# Patient Record
Sex: Male | Born: 1975 | Race: White | Hispanic: No | Marital: Single | Smoking: Current every day smoker
Health system: Southern US, Community
[De-identification: ages and names within clinical notes are randomized; demographics above are authoritative.]

## PROBLEM LIST (undated history)

## (undated) DIAGNOSIS — J45909 Unspecified asthma, uncomplicated: Secondary | ICD-10-CM

## (undated) DIAGNOSIS — D649 Anemia, unspecified: Secondary | ICD-10-CM

## (undated) DIAGNOSIS — I2510 Atherosclerotic heart disease of native coronary artery without angina pectoris: Secondary | ICD-10-CM

## (undated) DIAGNOSIS — N19 Unspecified kidney failure: Secondary | ICD-10-CM

## (undated) DIAGNOSIS — Z992 Dependence on renal dialysis: Secondary | ICD-10-CM

## (undated) DIAGNOSIS — H5440 Blindness, one eye, unspecified eye: Secondary | ICD-10-CM

## (undated) DIAGNOSIS — I10 Essential (primary) hypertension: Secondary | ICD-10-CM

## (undated) HISTORY — PX: KIDNEY TRANSPLANT: SHX239

## (undated) HISTORY — DX: Blindness, one eye, unspecified eye: H54.40

## (undated) HISTORY — DX: Anemia, unspecified: D64.9

## (undated) HISTORY — PX: JOINT REPLACEMENT: SHX530

## (undated) HISTORY — DX: Unspecified asthma, uncomplicated: J45.909

---

## 1998-12-02 ENCOUNTER — Encounter (HOSPITAL_COMMUNITY): Admission: RE | Admit: 1998-12-02 | Discharge: 1999-03-02 | Payer: Self-pay | Admitting: Nephrology

## 1998-12-02 ENCOUNTER — Ambulatory Visit (HOSPITAL_COMMUNITY): Admission: RE | Admit: 1998-12-02 | Discharge: 1998-12-02 | Payer: Self-pay | Admitting: Nephrology

## 1999-03-24 ENCOUNTER — Encounter (HOSPITAL_COMMUNITY): Admission: RE | Admit: 1999-03-24 | Discharge: 1999-06-22 | Payer: Self-pay | Admitting: Nephrology

## 1999-11-03 ENCOUNTER — Encounter (HOSPITAL_COMMUNITY): Admission: RE | Admit: 1999-11-03 | Discharge: 2000-02-01 | Payer: Self-pay | Admitting: Nephrology

## 1999-11-22 ENCOUNTER — Encounter: Payer: Self-pay | Admitting: Vascular Surgery

## 1999-11-22 ENCOUNTER — Ambulatory Visit (HOSPITAL_COMMUNITY): Admission: RE | Admit: 1999-11-22 | Discharge: 1999-11-22 | Payer: Self-pay | Admitting: Vascular Surgery

## 2000-08-08 ENCOUNTER — Encounter: Admission: RE | Admit: 2000-08-08 | Discharge: 2000-08-08 | Payer: Self-pay | Admitting: Nephrology

## 2000-08-08 ENCOUNTER — Encounter: Payer: Self-pay | Admitting: Nephrology

## 2000-10-09 ENCOUNTER — Ambulatory Visit (HOSPITAL_COMMUNITY): Admission: RE | Admit: 2000-10-09 | Discharge: 2000-10-09 | Payer: Self-pay | Admitting: Nephrology

## 2000-10-09 ENCOUNTER — Encounter: Payer: Self-pay | Admitting: Nephrology

## 2002-09-10 ENCOUNTER — Encounter (INDEPENDENT_AMBULATORY_CARE_PROVIDER_SITE_OTHER): Payer: Self-pay | Admitting: *Deleted

## 2002-09-10 ENCOUNTER — Inpatient Hospital Stay (HOSPITAL_COMMUNITY): Admission: RE | Admit: 2002-09-10 | Discharge: 2002-09-12 | Payer: Self-pay | Admitting: Surgery

## 2002-09-10 ENCOUNTER — Encounter: Payer: Self-pay | Admitting: Surgery

## 2002-11-21 ENCOUNTER — Encounter: Admission: RE | Admit: 2002-11-21 | Discharge: 2002-11-21 | Payer: Self-pay | Admitting: Nephrology

## 2002-11-21 ENCOUNTER — Encounter: Payer: Self-pay | Admitting: Nephrology

## 2002-12-26 ENCOUNTER — Emergency Department (HOSPITAL_COMMUNITY): Admission: EM | Admit: 2002-12-26 | Discharge: 2002-12-26 | Payer: Self-pay | Admitting: Emergency Medicine

## 2002-12-26 ENCOUNTER — Encounter: Payer: Self-pay | Admitting: Emergency Medicine

## 2003-01-04 ENCOUNTER — Encounter: Payer: Self-pay | Admitting: Emergency Medicine

## 2003-01-04 ENCOUNTER — Emergency Department (HOSPITAL_COMMUNITY): Admission: EM | Admit: 2003-01-04 | Discharge: 2003-01-05 | Payer: Self-pay | Admitting: Emergency Medicine

## 2003-10-02 ENCOUNTER — Ambulatory Visit (HOSPITAL_COMMUNITY): Admission: RE | Admit: 2003-10-02 | Discharge: 2003-10-02 | Payer: Self-pay | Admitting: Nephrology

## 2005-02-03 ENCOUNTER — Ambulatory Visit (HOSPITAL_COMMUNITY): Admission: RE | Admit: 2005-02-03 | Discharge: 2005-02-03 | Payer: Self-pay | Admitting: Nephrology

## 2009-04-02 ENCOUNTER — Ambulatory Visit: Payer: Self-pay | Admitting: *Deleted

## 2009-09-24 ENCOUNTER — Inpatient Hospital Stay (HOSPITAL_COMMUNITY): Admission: EM | Admit: 2009-09-24 | Discharge: 2009-09-29 | Payer: Self-pay | Admitting: Emergency Medicine

## 2009-09-25 ENCOUNTER — Ambulatory Visit: Payer: Self-pay | Admitting: Surgery

## 2009-09-28 ENCOUNTER — Ambulatory Visit: Payer: Self-pay | Admitting: Gastroenterology

## 2009-10-15 DIAGNOSIS — I10 Essential (primary) hypertension: Secondary | ICD-10-CM | POA: Insufficient documentation

## 2009-10-15 DIAGNOSIS — N186 End stage renal disease: Secondary | ICD-10-CM

## 2009-10-15 DIAGNOSIS — E785 Hyperlipidemia, unspecified: Secondary | ICD-10-CM | POA: Insufficient documentation

## 2009-10-16 ENCOUNTER — Ambulatory Visit: Payer: Self-pay | Admitting: Pulmonary Disease

## 2009-10-16 DIAGNOSIS — M310 Hypersensitivity angiitis: Secondary | ICD-10-CM

## 2009-10-20 ENCOUNTER — Telehealth (INDEPENDENT_AMBULATORY_CARE_PROVIDER_SITE_OTHER): Payer: Self-pay | Admitting: *Deleted

## 2009-10-21 ENCOUNTER — Ambulatory Visit: Payer: Self-pay | Admitting: Vascular Surgery

## 2009-10-26 ENCOUNTER — Ambulatory Visit (HOSPITAL_COMMUNITY): Admission: RE | Admit: 2009-10-26 | Discharge: 2009-10-26 | Payer: Self-pay | Admitting: Vascular Surgery

## 2010-10-14 NOTE — Assessment & Plan Note (Signed)
Summary: consult for Goodpasture's   Copy to:  Elvis Coil Primary Provider/Referring Provider:  Deterding  CC:  Pulmonary Consult.  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  He was diagnosed at age 53 with the symptoms of hemoptysis and hematuria, and was treated with cytoxan, prednisone, and what sounds like plasmapheresis.  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  The pt is currently on chronic HD.  He has not been on cytoxan for years, and has been off prednisone for 6mos prior to his recent surgery (was on a short taper post op).  The pt has had no further hemoptysis since his original presentation that resulted in his diagnosis.  He denies any exercise tolerance issues, cough, congestion, or other pulmonary symptoms.  His cxr recently is unremarkable.  Preventive Screening-Counseling & Management  Alcohol-Tobacco     Smoking Status: quit  Current Medications (verified): 1)  Labetalol Hcl 200 Mg Tabs (Labetalol Hcl) .Marland Kitchen.. 1 Two Times A Day 2)  Nephro-Vite 0.8 Mg Tabs (B Complex-C-Folic Acid) .Marland Kitchen.. 1 Once Daily 3)  Lisinopril 10 Mg Tabs (Lisinopril) .Marland Kitchen.. 1 At Bedtime 4)  Phoslo 667 Mg Caps (Calcium Acetate (Phos Binder)) .... Take 3 Tabs Before Meals  Allergies (verified): No Known Drug Allergies  Past History:  Past Medical History:  HYPERTENSION (ICD-401.9) HYPERLIPIDEMIA (ICD-272.4) ESRD (ICD-585.6) Good Pasture Syndrome.   Past Surgical History: Nephrectomy: R kidney Transplant 2006 and then removed 09/2009. L kidney removed   Family History: Reviewed history and no changes required. heart disease: father   Social History: Reviewed history and no changes required. Patient states former smoker.  started at age 50.  1 ppd.  quit 2006 pt is  single.  Lives with girlfriend, Jonetta Osgood. Pt has children. pt is disabled.  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  Smoking Status:  quit  Review of Systems       The patient complains of loss of appetite.  The patient denies shortness of breath with activity, shortness of breath at rest, productive cough, non-productive cough, coughing up blood, chest pain, irregular heartbeats, acid heartburn, indigestion, weight change, abdominal pain, difficulty swallowing, sore throat, tooth/dental problems, headaches, nasal congestion/difficulty breathing through nose, sneezing, itching, ear ache, anxiety, depression, hand/feet swelling, joint stiffness or pain, rash, change in color of mucus, and fever.    Vital Signs:  Patient profile:   35 year old male Height:      72 inches Weight:      158.13 pounds BMI:     21.52 O2 Sat:      98 % on Room air Temp:     98.1 degrees F oral Pulse rate:   111 / minute BP sitting:   148 / 92  (right arm) Cuff size:   regular  Vitals Entered By: Arman Filter LPN (October 16, 2009 9:09 AM)  O2 Flow:  Room air CC: Pulmonary Consult Comments Medications reviewed with patient Arman Filter LPN  October 16, 2009 9:10 AM    Physical Exam  General:  thin male in nad Eyes:  PERRLA and EOMI.   Nose:  patent without discharge or purulence Mouth:  clear Neck:  no jvd, tmg, LN central catheter tunnel noted. Lungs:  totally clear to auscultation Heart:  rrr, no mrg. Abdomen:  soft and nontender, bs+  Extremities:  no edema, pulses intact distally no cyanosis Neurologic:  alert and oriented,  moves all 4.   Impression & Recommendations:  Problem # 1:  GOODPASTURES SYNDROME (ICD-446.21) The pt has a history of Goodpasture's syndrome, but has not had hemoptysis since his initial presentation at age 81.  He currently does not have any pulmonary symptoms, and his most recent cxr is clear.  He is now off immunosuppression, and the possibility always exists  that he could have a recurrence of his disease in the lungs.  At this point, would just monitor him and see how things go.  I would be happy to see again if issues arise.  Medications Added to Medication List This Visit: 1)  Phoslo 667 Mg Caps (Calcium acetate (phos binder)) .... Take 3 tabs before meals  Other Orders: Consultation Level III (04540)  Patient Instructions: 1)  no pulmonary intervention at this time 2)  please call if pulmonary issues develop, and will see as needed.

## 2010-10-14 NOTE — Progress Notes (Signed)
Summary: fax request  Phone Note From Pharmacy   Caller: Morrie Sheldon at [facility] Call For: clance  Summary of Call: please fax ov notes when available to attn: ashley- [phone]. call back # [phone] Initial call taken by: Tivis Ringer, CNA,  October 20, 2009 9:54 AM  Follow-up for Phone Call        megan, please have note faxed, but also need last note from [facility] on this patient as well.  faxed note 2/14//Juanita  Follow-up by: Barbaraann Share MD,  October 24, 2009 11:35 AM  Additional Follow-up for Phone Call Additional follow up Details #1::        Juanita faxed note today. Aundra Millet Reynolds LPN  October 26, 2009 9:55 AM

## 2010-11-28 LAB — DIFFERENTIAL
Basophils Relative: 0 % (ref 0–1)
Eosinophils Absolute: 0.2 10*3/uL (ref 0.0–0.7)
Eosinophils Relative: 5 % (ref 0–5)
Lymphocytes Relative: 20 % (ref 12–46)
Lymphs Abs: 1 10*3/uL (ref 0.7–4.0)
Monocytes Relative: 10 % (ref 3–12)
Neutro Abs: 3.3 10*3/uL (ref 1.7–7.7)
Neutrophils Relative %: 65 % (ref 43–77)

## 2010-11-28 LAB — CBC
HCT: 26 % — ABNORMAL LOW (ref 39.0–52.0)
HCT: 26.3 % — ABNORMAL LOW (ref 39.0–52.0)
Hemoglobin: 9 g/dL — ABNORMAL LOW (ref 13.0–17.0)
Hemoglobin: 9.9 g/dL — ABNORMAL LOW (ref 13.0–17.0)
MCHC: 34.4 g/dL (ref 30.0–36.0)
MCHC: 34.7 g/dL (ref 30.0–36.0)
MCHC: 34.8 g/dL (ref 30.0–36.0)
MCHC: 35.3 g/dL (ref 30.0–36.0)
MCV: 82.7 fL (ref 78.0–100.0)
MCV: 83.1 fL (ref 78.0–100.0)
Platelets: 30 10*3/uL — ABNORMAL LOW (ref 150–400)
RBC: 3.15 MIL/uL — ABNORMAL LOW (ref 4.22–5.81)
RBC: 3.37 MIL/uL — ABNORMAL LOW (ref 4.22–5.81)
WBC: 3.8 10*3/uL — ABNORMAL LOW (ref 4.0–10.5)
WBC: 4.1 10*3/uL (ref 4.0–10.5)

## 2010-11-28 LAB — RENAL FUNCTION PANEL
Albumin: 2.7 g/dL — ABNORMAL LOW (ref 3.5–5.2)
Albumin: 2.8 g/dL — ABNORMAL LOW (ref 3.5–5.2)
Albumin: 2.9 g/dL — ABNORMAL LOW (ref 3.5–5.2)
BUN: 120 mg/dL — ABNORMAL HIGH (ref 6–23)
BUN: 121 mg/dL — ABNORMAL HIGH (ref 6–23)
CO2: 15 mEq/L — ABNORMAL LOW (ref 19–32)
CO2: 18 mEq/L — ABNORMAL LOW (ref 19–32)
CO2: 18 mEq/L — ABNORMAL LOW (ref 19–32)
Calcium: 5.5 mg/dL — CL (ref 8.4–10.5)
Chloride: 93 mEq/L — ABNORMAL LOW (ref 96–112)
Chloride: 93 mEq/L — ABNORMAL LOW (ref 96–112)
Creatinine, Ser: 25.52 mg/dL (ref 0.4–1.5)
GFR calc Af Amer: 2 mL/min — ABNORMAL LOW (ref >60)
GFR calc Af Amer: 2 mL/min — ABNORMAL LOW (ref >60)
GFR calc Af Amer: 3 mL/min — ABNORMAL LOW (ref >60)
GFR calc non Af Amer: 2 mL/min — ABNORMAL LOW (ref >60)
GFR calc non Af Amer: 2 mL/min — ABNORMAL LOW (ref >60)
GFR calc non Af Amer: 2 mL/min — ABNORMAL LOW (ref >60)
Phosphorus: 9 mg/dL — ABNORMAL HIGH (ref 2.3–4.6)
Potassium: 4 mEq/L (ref 3.5–5.1)

## 2010-11-28 LAB — PTH, INTACT AND CALCIUM
Calcium, Total (PTH): 4.5 mg/dL — CL (ref 8.4–10.5)
PTH: 90.3 pg/mL — ABNORMAL HIGH (ref 14.0–72.0)

## 2010-11-28 LAB — URINALYSIS, ROUTINE W REFLEX MICROSCOPIC
Glucose, UA: NEGATIVE mg/dL
Ketones, ur: 15 mg/dL — AB
Nitrite: NEGATIVE
Urobilinogen, UA: 0.2 mg/dL (ref 0.0–1.0)

## 2010-11-28 LAB — URINE CULTURE: Colony Count: NO GROWTH

## 2010-11-28 LAB — URINE MICROSCOPIC-ADD ON

## 2010-11-28 LAB — COMPREHENSIVE METABOLIC PANEL
Calcium: 4.6 mg/dL — CL (ref 8.4–10.5)
Chloride: 90 mEq/L — ABNORMAL LOW (ref 96–112)
Creatinine, Ser: 24.6 mg/dL (ref 0.4–1.5)
Sodium: 128 mEq/L — ABNORMAL LOW (ref 135–145)
Total Bilirubin: 0.5 mg/dL (ref 0.3–1.2)

## 2010-11-28 LAB — PATHOLOGIST SMEAR REVIEW

## 2010-11-28 LAB — PLT GLYCOPROTEIN (INDIRECT) AUTOABS
Plt GP Ia/IIa Autoabs: DETECTED
Plt Glycoprotein Ib/IX Autoabs: NOT DETECTED

## 2010-11-28 LAB — GLOMERULAR BASEMENT MEMBRANE ANTIBODIES

## 2010-11-28 LAB — CALCIUM, IONIZED: Calcium, Ion: 0.52 mmol/L — CL (ref 1.12–1.32)

## 2010-11-28 LAB — FERRITIN: Ferritin: 1086 ng/mL — ABNORMAL HIGH (ref 22–322)

## 2010-11-28 LAB — HEPATITIS B SURFACE ANTIGEN: Hepatitis B Surface Ag: NEGATIVE

## 2010-11-28 LAB — IRON AND TIBC: UIBC: 22 ug/dL

## 2010-11-29 LAB — CBC
HCT: 23.7 % — ABNORMAL LOW (ref 39.0–52.0)
HCT: 24.1 % — ABNORMAL LOW (ref 39.0–52.0)
HCT: 25.2 % — ABNORMAL LOW (ref 39.0–52.0)
HCT: 25.9 % — ABNORMAL LOW (ref 39.0–52.0)
Hemoglobin: 8.3 g/dL — ABNORMAL LOW (ref 13.0–17.0)
Hemoglobin: 8.4 g/dL — ABNORMAL LOW (ref 13.0–17.0)
Hemoglobin: 8.6 g/dL — ABNORMAL LOW (ref 13.0–17.0)
Hemoglobin: 8.9 g/dL — ABNORMAL LOW (ref 13.0–17.0)
MCHC: 34.3 g/dL (ref 30.0–36.0)
MCHC: 34.3 g/dL (ref 30.0–36.0)
MCHC: 34.7 g/dL (ref 30.0–36.0)
MCV: 83.1 fL (ref 78.0–100.0)
MCV: 83.1 fL (ref 78.0–100.0)
MCV: 83.8 fL (ref 78.0–100.0)
Platelets: 32 10*3/uL — ABNORMAL LOW (ref 150–400)
Platelets: 40 10*3/uL — ABNORMAL LOW (ref 150–400)
RBC: 2.85 MIL/uL — ABNORMAL LOW (ref 4.22–5.81)
RBC: 2.93 MIL/uL — ABNORMAL LOW (ref 4.22–5.81)
RBC: 3.03 MIL/uL — ABNORMAL LOW (ref 4.22–5.81)
RDW: 13.9 % (ref 11.5–15.5)
WBC: 4.1 10*3/uL (ref 4.0–10.5)

## 2010-11-29 LAB — RENAL FUNCTION PANEL
Albumin: 2.6 g/dL — ABNORMAL LOW (ref 3.5–5.2)
Albumin: 2.7 g/dL — ABNORMAL LOW (ref 3.5–5.2)
Albumin: 2.7 g/dL — ABNORMAL LOW (ref 3.5–5.2)
BUN: 72 mg/dL — ABNORMAL HIGH (ref 6–23)
BUN: 79 mg/dL — ABNORMAL HIGH (ref 6–23)
CO2: 28 mEq/L (ref 19–32)
Calcium: 5.6 mg/dL — CL (ref 8.4–10.5)
Calcium: 6 mg/dL — CL (ref 8.4–10.5)
Calcium: 6.6 mg/dL — ABNORMAL LOW (ref 8.4–10.5)
Calcium: 6.8 mg/dL — ABNORMAL LOW (ref 8.4–10.5)
Chloride: 96 mEq/L (ref 96–112)
Chloride: 96 mEq/L (ref 96–112)
Chloride: 97 mEq/L (ref 96–112)
Creatinine, Ser: 10.94 mg/dL — ABNORMAL HIGH (ref 0.4–1.5)
Creatinine, Ser: 14.81 mg/dL — ABNORMAL HIGH (ref 0.4–1.5)
Creatinine, Ser: 15.6 mg/dL — ABNORMAL HIGH (ref 0.4–1.5)
GFR calc Af Amer: 3 mL/min — ABNORMAL LOW (ref >60)
GFR calc Af Amer: 4 mL/min — ABNORMAL LOW (ref >60)
GFR calc Af Amer: 5 mL/min — ABNORMAL LOW (ref >60)
GFR calc Af Amer: 7 mL/min — ABNORMAL LOW (ref >60)
GFR calc non Af Amer: 3 mL/min — ABNORMAL LOW (ref >60)
GFR calc non Af Amer: 4 mL/min — ABNORMAL LOW (ref >60)
GFR calc non Af Amer: 4 mL/min — ABNORMAL LOW (ref >60)
GFR calc non Af Amer: 5 mL/min — ABNORMAL LOW (ref >60)
Glucose, Bld: 117 mg/dL — ABNORMAL HIGH (ref 70–99)
Glucose, Bld: 137 mg/dL — ABNORMAL HIGH (ref 70–99)
Phosphorus: 6 mg/dL — ABNORMAL HIGH (ref 2.3–4.6)
Phosphorus: 6.9 mg/dL — ABNORMAL HIGH (ref 2.3–4.6)
Sodium: 132 mEq/L — ABNORMAL LOW (ref 135–145)
Sodium: 136 mEq/L (ref 135–145)

## 2010-11-29 LAB — TYPE AND SCREEN: ABO/RH(D): O POS

## 2010-11-29 LAB — IRON AND TIBC: UIBC: <55 ug/dL

## 2010-11-29 LAB — STOOL CULTURE

## 2010-11-29 LAB — HEPARIN INDUCED THROMBOCYTOPENIA PNL
Heparin Induced Plt Ab: POSITIVE
Serotonin Release: 0 % release (ref <20)

## 2010-11-29 LAB — LACTATE DEHYDROGENASE: LDH: 333 U/L — ABNORMAL HIGH (ref 94–250)

## 2010-11-29 LAB — DIC (DISSEMINATED INTRAVASCULAR COAGULATION)PANEL
INR: 1.09 (ref 0.00–1.49)
Prothrombin Time: 14 seconds (ref 11.6–15.2)
aPTT: 44 seconds — ABNORMAL HIGH (ref 24–37)

## 2010-11-29 LAB — DIFFERENTIAL
Basophils Relative: 0 % (ref 0–1)
Monocytes Absolute: 0.3 10*3/uL (ref 0.1–1.0)
Monocytes Relative: 10 % (ref 3–12)
Neutro Abs: 2.2 10*3/uL (ref 1.7–7.7)

## 2010-11-29 LAB — CLOSTRIDIUM DIFFICILE EIA

## 2010-11-29 LAB — APTT: aPTT: 46 seconds — ABNORMAL HIGH (ref 24–37)

## 2010-11-29 LAB — HEMOCCULT GUIAC POC 1CARD (OFFICE): Fecal Occult Bld: POSITIVE

## 2010-11-29 LAB — EHEC TOXIN BY EIA, STOOL

## 2010-12-03 LAB — POCT I-STAT 4, (NA,K, GLUC, HGB,HCT)
HCT: 30 % — ABNORMAL LOW (ref 39.0–52.0)
Hemoglobin: 10.2 g/dL — ABNORMAL LOW (ref 13.0–17.0)
Potassium: 4.5 mEq/L (ref 3.5–5.1)
Sodium: 136 mEq/L (ref 135–145)

## 2011-01-25 NOTE — Procedures (Signed)
CEPHALIC VEIN MAPPING   INDICATION:  Preop evaluation for AV fistula placement.   HISTORY:  End-stage renal disease.   EXAM:  The right cephalic vein is compressible with diameter measurements  ranging from 0.18 to 0.31 cm.   The left basilic vein is compressible with diameter measurements ranging  from 0.68 to 0.84 cm.   The left cephalic vein is compressible at the upper arm level with  diameter measurements ranging from 0.26 to 0.38 cm.   The left basilic vein is compressible with diameter measurements ranging  from 0.59 to 0.65 cm.   See attached worksheet for all measurements.   IMPRESSION:  Patent bilateral cephalic and basilic veins with diameter  measurements as described above and on the attached worksheet.   ___________________________________________  Janetta Hora. Fields, MD   CH/MEDQ  D:  10/23/2009  T:  10/23/2009  Job:  [id number]

## 2011-01-25 NOTE — Assessment & Plan Note (Signed)
OFFICE VISIT   Curtis Rangel, Curtis Rangel A  DOB:  04-01-1976                                       04/02/2009  ZOXWR#:[id number]   The patient is a 35 year old gentleman with a history of end-stage renal  failure and a left Cimino arteriovenous fistula created approximately 10  years ago by Dr. Hart Rochester.  He now has kidney transplant and has been off  hemodialysis for 3 years.  His left arm AV fistula has functioned until  recently when he traumatized it and noticed a loss of the thrill.   Evaluation does reveal a thrombosed left Cimino arteriovenous fistula.  There is some mild erythema over this.  He  has a 2+ left radial pulse.   The patient has had recent traumatic thrombosis of his left arm Cimino  fistula.  I think this will settle down with conservative treatment.  Asked him to put some warm compresses on this.  If it remains painful he  will return to the office for further assessment and possible excision.   Balinda Quails, M.D.  Electronically Signed   PGH/MEDQ  D:  04/02/2009  T:  04/03/2009  Job:  [id number]   cc:   Fayrene Fearing L. Deterding, M.D.

## 2011-01-25 NOTE — Assessment & Plan Note (Signed)
OFFICE VISIT   Curtis Rangel, Curtis Rangel A  DOB:  1976-06-29                                       10/21/2009  ZOXWR#:[id number]   CHIEF COMPLAINT:  Needs access for dialysis.   HISTORY OF PRESENT ILLNESS:  The patient is a 35 year old male who  previously was on hemodialysis, but received a kidney transplant.  This  kidney transplant lasted 5 years.  He now is requiring hemodialysis  again.  He currently has a right-sided catheter.  He dialyzes on  Tuesday, Thursday, and Saturday.  He is right-handed.  He previously had  a left radiocephalic AV fistula which lasted for several years.   CHRONIC MEDICAL PROBLEMS:  Include hypertension, elevated cholesterol,  and renal failure, all which are currently controlled.   PAST SURGICAL HISTORY:  He had a right nephrectomy.  He had a kidney  transplant.  He has had a dialysis catheter and the above-mentioned  fistula.   FAMILY HISTORY:  Unremarkable.   SOCIAL HISTORY:  He is single and has 1 child.  He is a former smoker  but quit 5 years ago.  He does not consume alcohol regularly.   REVIEW OF SYSTEMS:  Full 12-point review of systems was performed with  the patient.  Please see intake referral form for details regarding  this.   PHYSICAL EXAM:  Blood pressure is 154/87 in the right arm, heart rate 97  and regular, temperature is 98.2.  HEENT is unremarkable.  Neck has 2+  carotid pulses without bruit.  Chest is clear to auscultation.  Cardiac  exam is regular rate and rhythm.  Upper extremities:  He has 2+ brachial  and radial pulses bilaterally.  Skin:  He has no rashes.  Left forearm  is remarkable for a thrombosed left AV fistula.  On placement of  tourniquet in the left upper arm, there is an easily palpable large  basilic vein extending down below the antecubital crease.   He had a vein mapping ultrasound today which shows the basilic vein is  between 58 and 84 mm in diameter.  The upper arm cephalic vein is  between 26 and 38 mm in diameter.  It has a few side branches pain.  Vein mapping on the right side has similar features.  The cephalic vein  at the wrist level on the right is fairly small.   I believe the best option for the patient at this point would be  placement of a left basilic vein transposition fistula.  I do not  believe that the cephalic vein in the upper arm would be big enough and  the basilic vein is certainly large enough on exam and by ultrasound.  We have scheduled this for October 26, 2009.  Risks, benefits, possible  complications, procedure details were explained to the patient today  including but not limited to bleeding, infection, ischemic steal,  nonmaturation of the fistula, and long-term thrombosis.  He understands  and agrees to proceed.     Janetta Hora. Fields, MD  Electronically Signed   CEF/MEDQ  D:  10/26/2009  T:  10/26/2009  Job:  [id number]   cc:   Garnetta Buddy, M.D.

## 2011-01-28 NOTE — Op Note (Signed)
NAME:  Curtis Rangel, Curtis Rangel                        ACCOUNT NO.:  [id number]   MEDICAL RECORD NO.:  [id number]                   PATIENT TYPE:  INP   LOCATION:  2550                                 FACILITY:  [facility]   PHYSICIAN:  Velora Heckler, M.D.                DATE OF BIRTH:  1975/11/16   DATE OF PROCEDURE:  09/10/2002  DATE OF DISCHARGE:                                 OPERATIVE REPORT   PREOPERATIVE DIAGNOSIS:  Secondary hyperparathyroidism, end-stage renal  disease.   POSTOPERATIVE DIAGNOSIS:  Secondary hyperparathyroidism, end-stage renal  disease.   OPERATION:  Total parathyroidectomy with auto transplantation to right  forearm.   SURGEON:  Velora Heckler, M.D.   ASSISTANT:  Abigail Miyamoto, M.D.   ANESTHESIA:  General   ANESTHESIOLOGIST:  Edwin Cap. Zoila Shutter, M.D.   ESTIMATED BLOOD LOSS:  Minimal   PREPARATION:  Betadine   COMPLICATIONS:  None   INDICATIONS FOR PROCEDURE:  The patient is a 35 year old white male from  [place], [place] who presents at the request of Dr. Fayrene Fearing  Deterding for evaluation of secondary hyperparathyroidism.  The patient has  had end-stage renal disease due to Goodpasture's Syndrome.  He has been on  hemodialysis for two years.  The patient is on the transplant list at [facility].  The patient; however, was diagnosed with  secondary hyperparathyroidism and now comes for surgical treatment.   DESCRIPTION OF PROCEDURE:  The procedure is done in OR #17 at the [facility].  The patient is brought to the operating room and  placed in a supine position on the operating room table.  Following the  administration of general anesthesia, the patient is positioned and then  prepped and draped in the usual strict aseptic fashion.  After ascertaining  that an adequate level of anesthesia had been obtained, a Kocher incision is  made in the anterior neck with a #10 blade.  Dissection is  carried down  through the subcutaneous tissues and platysma.  Hemostasis is obtained with  the electrocautery.  Skin flaps are developed cephalad and caudad from the  thyroid notch to the sternal notch.  A mini Horner self retaining retractor  is placed for exposure.  Strap muscles are incised in the midline.  Dissection is begun on the left side of the neck.  The strap muscles are  reflected laterally.  Left thyroid lobe is gently dissected out.  Venous  tributaries are divided between small ligaclips.  On exploration along the  left tracheoesophageal groove, an apparent hypoplastic parathyroid gland was  identified in the thyrothymic tract.  It is dissected out.  Vascular pedicle  was divided between small ligaclips.  Gland is completely excised.  It  measures 1.5 x 0.9 x 0.7 cm.  Frozen section biopsy is submitted to  pathology. Dr. Charlott Rakes reviewed this and saw only thymic tissue and no  evidence of parathyroid tissue.  A second fragment of the left inferior  gland was taken and submitted to pathology.  At this time, a reading of  parathyroid tissue was definitely seen.  Further exploration in the left  neck reveals a subcapsular left superior parathyroid gland.  The capsule of  the thyroid is open and this gland is carefully dissected out.  Vascular  pedicle was again divided between small ligaclips.  Gland is completely  excised.  Measurements show it to be 1.3 x 1.1 x 0.5 cm in size.  Frozen  section biopsy again confirms parathyroid tissue.  Dry pack is placed in the  left neck.  We then turned our attention to the right side.  Again, strap  muscles are reflected laterally.  Venous tributaries are divided between  small ligaclips.  Immediately on exploring the right thyroid lobe, there is  an inferior parathyroid gland adherent to the inferior pole of the thyroid.  It is quite evident.  It is carefully dissected out.  Vascular pedicles  again divided between small ligaclips.   Gland is freed from adventitial  tissue and excised completely from the neck.  Measurements of the right  inferior parathyroid gland show it to be 1.7 x 1.5 x 1.0 cm in size.  Frozen  section biopsy again confirms parathyroid tissue.  Further exploration on  the right side requires mobilization of the entire right lobe.  On palpation  a gland is palpable along the superior pole.  This gland is quite high and  located lateral to the superior pole vessels.  It is carefully dissected  out.  Capsule was opened to mobilize the gland.  Vascular pedicle comes in  to the gland from the superior aspect.  It is divided between medium  ligaclips.  Gland is then excised off of the capsule of the thyroid.  It is  completely removed from the neck in its totality.  It measures 2.4 x 1.0 x  1.1 cm in size.  Frozen section biopsy again confirms parathyroid tissue.  A  dry pack was placed in the right neck.  Left neck is then re-examined.  There is a small nodule of tissue which is suspended from the inferior pole  of the left thyroid lobe.  It is excised and submitted for frozen section  biopsy.  It does demonstrate benign thyroid tissue.  Good hemostasis is  obtained on both sides of the neck.  Strap muscles were reapproximated in  the midline with interrupted 3-0 Vicryl sutures.  Platysma is reapproximated  with interrupted 3-0 Vicryl sutures.  Skin edges were reapproximated with  widely spaced stainless steel staples and the interspace with 1/2 inch Steri-  Strips.  Next, we turned our attention to the right forearm.  Right forearm  is prepped and draped in the usual strict aseptic fashion.  After  ascertaining that adequate anesthesia had been maintained, a 5 cm incision  was made with a #10 blade.  Dissection was carried down through the  subcutaneous tissues.  Hemostasis obtained with the electrocautery.  Skin  flaps are developed, leaving the muscle fascia intact.  A Weitlander retractor was placed  for exposure.  The left superior parathyroid gland is  selected.  In iced saline, it is suctioned into 12 fragments measuring  approximately 1 mm cubed each.  Each of these 12 fragments is then implanted  into the right brachioradialis muscle by incising the fascia.  Splitting the  muscle, inserting the fragment of parathyroid tissue,  and closing the fascia  with 4-0 Prolene suture.  This was performed 12 times with a suture marking  the site of insertion of each fragment of parathyroid gland.  Good  hemostasis is noted.  Subcutaneous tissue is reapproximated with interrupted  3-0 Vicryl suture.  Skin edges were reapproximated with widely spaced  stainless steel staples and 1/2 inch Steri-Strips.  Wounds were washed and  dried and sterile dressings are applied to each.  The patient was awakened  from anesthesia and brought to the recovery room in stable condition.  The  patient tolerated the procedure well.                                               Velora Heckler, M.D.    TMG/MEDQ  D:  09/10/2002  T:  09/10/2002  Job:  [id number]   cc:   Fayrene Fearing L. Deterding, M.D.  [address] [place]  [place] [address]  Fax: [phone]   [facility]

## 2011-02-06 IMAGING — CR DG CHEST 2V
2 series · 2 of 2 positions shown · non-contrast
Comparison: Report of chest radiograph 01/04/2003.  Images are no
longer available.

CLINICAL DATA: Abdominal pain.  Renal failure.  Post transplant.

CHEST - 2 VIEW

[w chest pa]
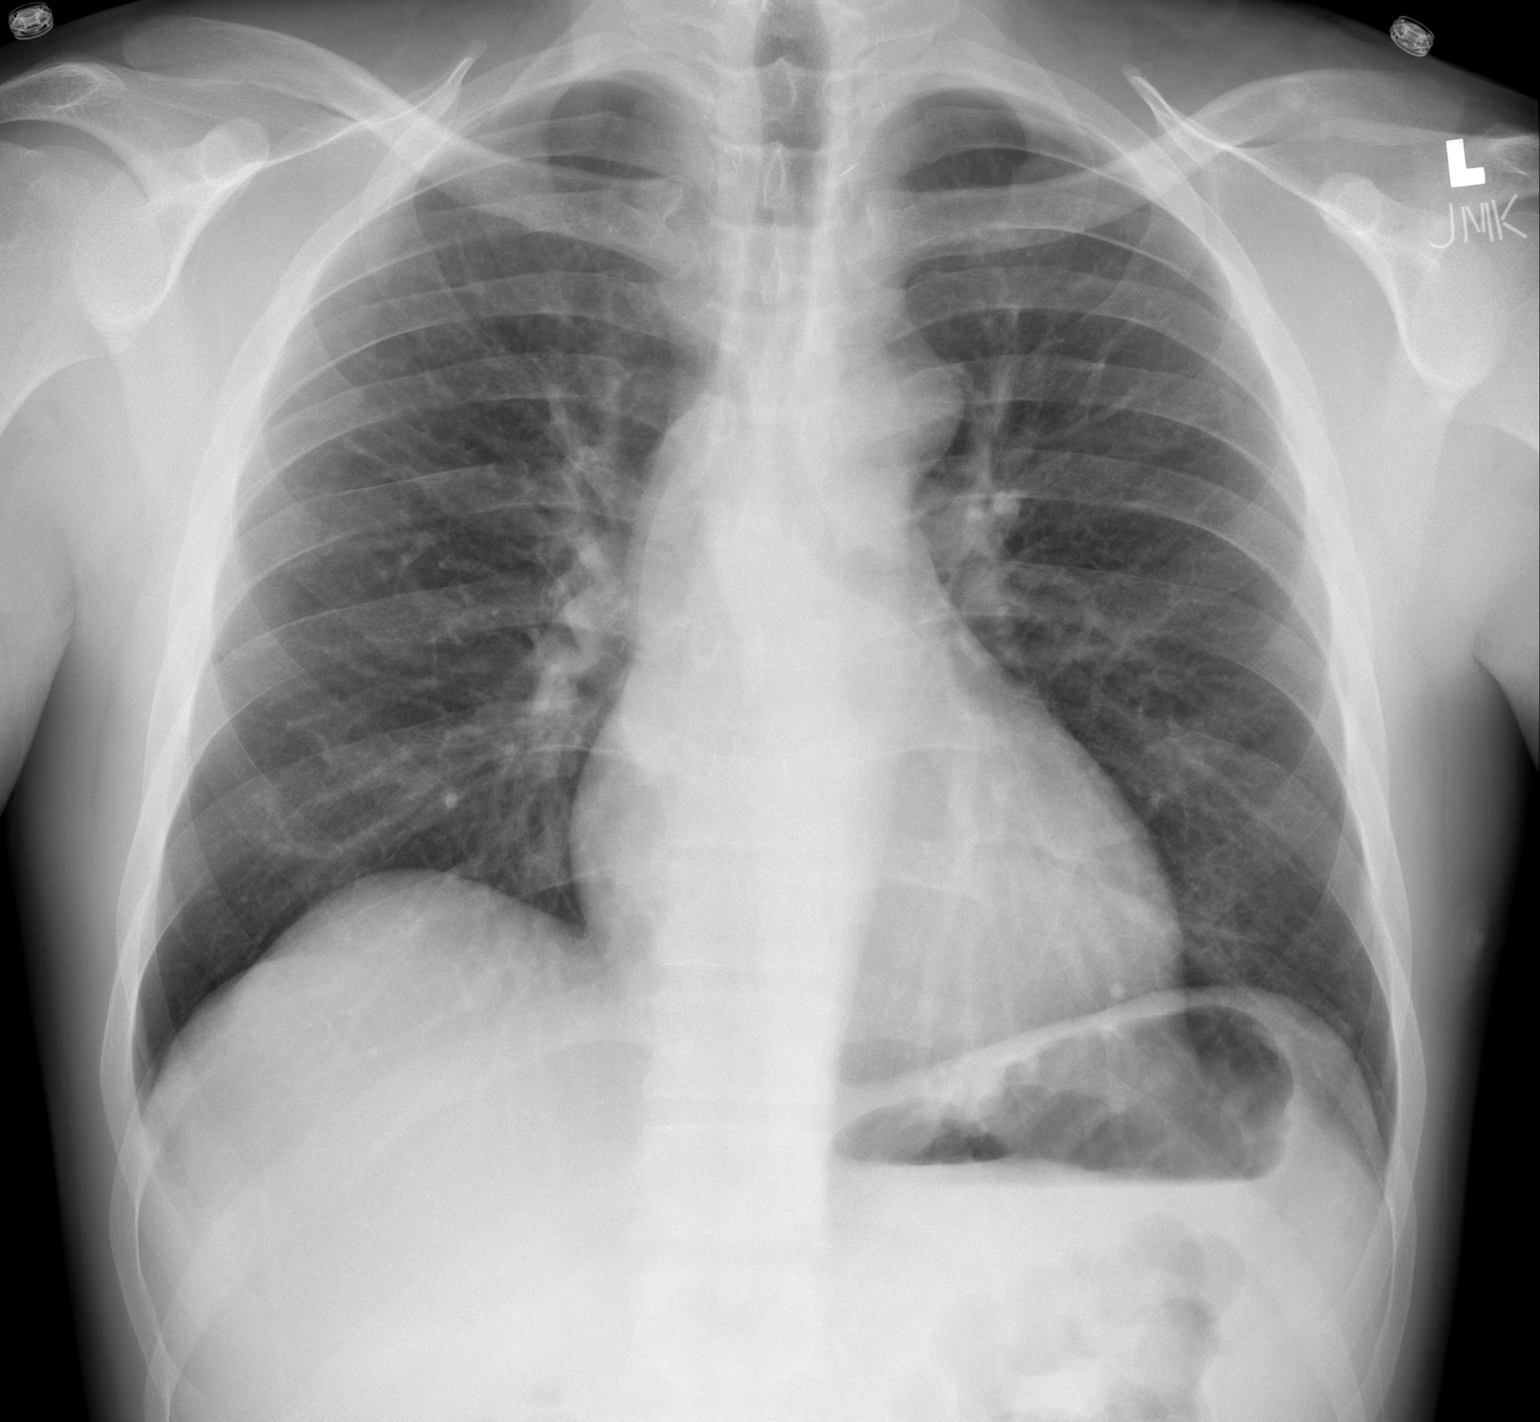

[w chest lat]
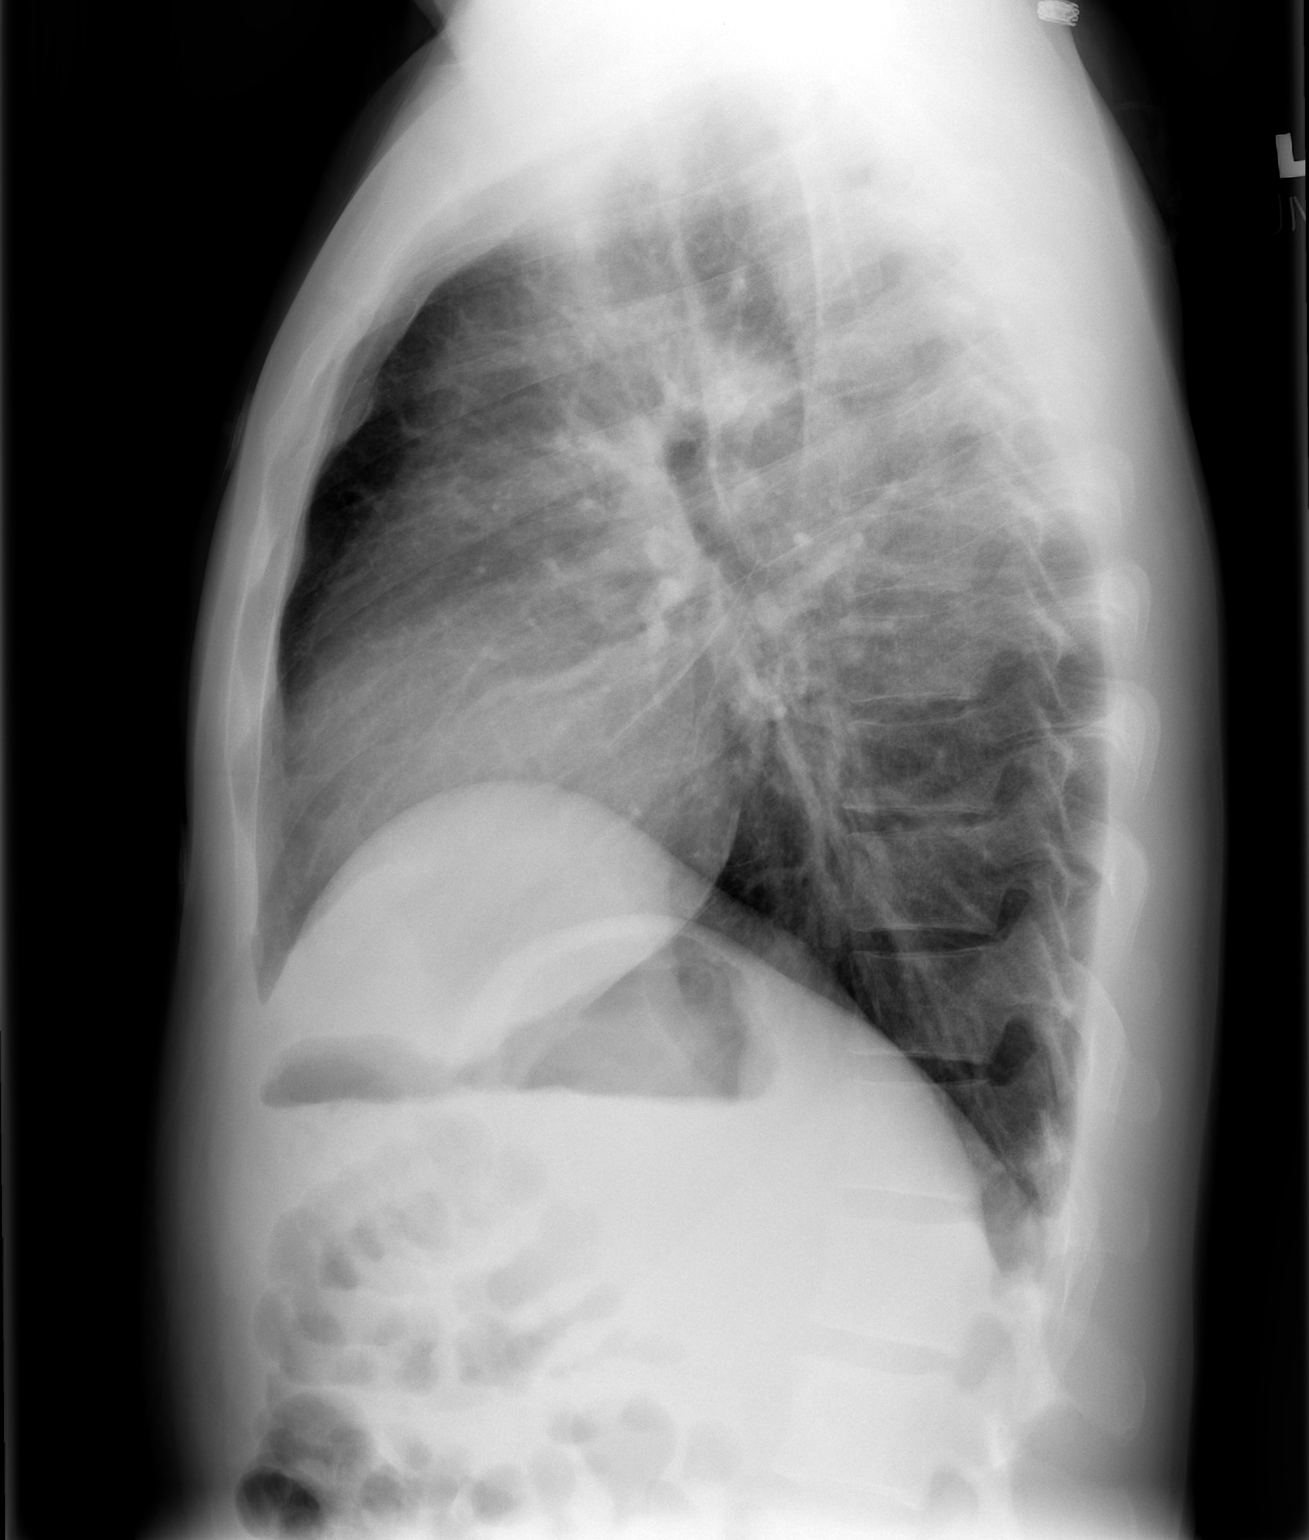

[2 of 2 positions shown; findings below may reference images not displayed]

FINDINGS: The cardiopericardial silhouette is within normal limits
for size.  The lungs are clear.  The the visualized soft tissues
and bony thorax are unremarkable.
IMPRESSION: No acute cardiopulmonary disease.

## 2011-09-17 DIAGNOSIS — N186 End stage renal disease: Secondary | ICD-10-CM | POA: Diagnosis not present

## 2011-09-17 DIAGNOSIS — D509 Iron deficiency anemia, unspecified: Secondary | ICD-10-CM | POA: Diagnosis not present

## 2011-09-17 DIAGNOSIS — N039 Chronic nephritic syndrome with unspecified morphologic changes: Secondary | ICD-10-CM | POA: Diagnosis not present

## 2011-09-17 DIAGNOSIS — D631 Anemia in chronic kidney disease: Secondary | ICD-10-CM | POA: Diagnosis not present

## 2011-09-20 DIAGNOSIS — D509 Iron deficiency anemia, unspecified: Secondary | ICD-10-CM | POA: Diagnosis not present

## 2011-09-20 DIAGNOSIS — D631 Anemia in chronic kidney disease: Secondary | ICD-10-CM | POA: Diagnosis not present

## 2011-09-20 DIAGNOSIS — N186 End stage renal disease: Secondary | ICD-10-CM | POA: Diagnosis not present

## 2011-09-22 DIAGNOSIS — D509 Iron deficiency anemia, unspecified: Secondary | ICD-10-CM | POA: Diagnosis not present

## 2011-09-22 DIAGNOSIS — N186 End stage renal disease: Secondary | ICD-10-CM | POA: Diagnosis not present

## 2011-09-22 DIAGNOSIS — D631 Anemia in chronic kidney disease: Secondary | ICD-10-CM | POA: Diagnosis not present

## 2011-09-24 DIAGNOSIS — D631 Anemia in chronic kidney disease: Secondary | ICD-10-CM | POA: Diagnosis not present

## 2011-09-24 DIAGNOSIS — N039 Chronic nephritic syndrome with unspecified morphologic changes: Secondary | ICD-10-CM | POA: Diagnosis not present

## 2011-09-24 DIAGNOSIS — N186 End stage renal disease: Secondary | ICD-10-CM | POA: Diagnosis not present

## 2011-09-24 DIAGNOSIS — D509 Iron deficiency anemia, unspecified: Secondary | ICD-10-CM | POA: Diagnosis not present

## 2011-09-27 DIAGNOSIS — D631 Anemia in chronic kidney disease: Secondary | ICD-10-CM | POA: Diagnosis not present

## 2011-09-27 DIAGNOSIS — N186 End stage renal disease: Secondary | ICD-10-CM | POA: Diagnosis not present

## 2011-09-27 DIAGNOSIS — D509 Iron deficiency anemia, unspecified: Secondary | ICD-10-CM | POA: Diagnosis not present

## 2011-09-29 DIAGNOSIS — D509 Iron deficiency anemia, unspecified: Secondary | ICD-10-CM | POA: Diagnosis not present

## 2011-09-29 DIAGNOSIS — D631 Anemia in chronic kidney disease: Secondary | ICD-10-CM | POA: Diagnosis not present

## 2011-09-29 DIAGNOSIS — N186 End stage renal disease: Secondary | ICD-10-CM | POA: Diagnosis not present

## 2011-10-01 DIAGNOSIS — N186 End stage renal disease: Secondary | ICD-10-CM | POA: Diagnosis not present

## 2011-10-01 DIAGNOSIS — D631 Anemia in chronic kidney disease: Secondary | ICD-10-CM | POA: Diagnosis not present

## 2011-10-01 DIAGNOSIS — D509 Iron deficiency anemia, unspecified: Secondary | ICD-10-CM | POA: Diagnosis not present

## 2011-10-04 DIAGNOSIS — D509 Iron deficiency anemia, unspecified: Secondary | ICD-10-CM | POA: Diagnosis not present

## 2011-10-04 DIAGNOSIS — D631 Anemia in chronic kidney disease: Secondary | ICD-10-CM | POA: Diagnosis not present

## 2011-10-04 DIAGNOSIS — N186 End stage renal disease: Secondary | ICD-10-CM | POA: Diagnosis not present

## 2011-10-08 DIAGNOSIS — D631 Anemia in chronic kidney disease: Secondary | ICD-10-CM | POA: Diagnosis not present

## 2011-10-08 DIAGNOSIS — D509 Iron deficiency anemia, unspecified: Secondary | ICD-10-CM | POA: Diagnosis not present

## 2011-10-08 DIAGNOSIS — N186 End stage renal disease: Secondary | ICD-10-CM | POA: Diagnosis not present

## 2011-10-11 DIAGNOSIS — N186 End stage renal disease: Secondary | ICD-10-CM | POA: Diagnosis not present

## 2011-10-11 DIAGNOSIS — D509 Iron deficiency anemia, unspecified: Secondary | ICD-10-CM | POA: Diagnosis not present

## 2011-10-11 DIAGNOSIS — D631 Anemia in chronic kidney disease: Secondary | ICD-10-CM | POA: Diagnosis not present

## 2011-10-13 DIAGNOSIS — N186 End stage renal disease: Secondary | ICD-10-CM | POA: Diagnosis not present

## 2011-10-14 DIAGNOSIS — D509 Iron deficiency anemia, unspecified: Secondary | ICD-10-CM | POA: Diagnosis not present

## 2011-10-14 DIAGNOSIS — N186 End stage renal disease: Secondary | ICD-10-CM | POA: Diagnosis not present

## 2011-10-14 DIAGNOSIS — N039 Chronic nephritic syndrome with unspecified morphologic changes: Secondary | ICD-10-CM | POA: Diagnosis not present

## 2011-10-14 DIAGNOSIS — D631 Anemia in chronic kidney disease: Secondary | ICD-10-CM | POA: Diagnosis not present

## 2011-10-18 DIAGNOSIS — D631 Anemia in chronic kidney disease: Secondary | ICD-10-CM | POA: Diagnosis not present

## 2011-10-18 DIAGNOSIS — N186 End stage renal disease: Secondary | ICD-10-CM | POA: Diagnosis not present

## 2011-10-18 DIAGNOSIS — D509 Iron deficiency anemia, unspecified: Secondary | ICD-10-CM | POA: Diagnosis not present

## 2011-10-21 DIAGNOSIS — N186 End stage renal disease: Secondary | ICD-10-CM | POA: Diagnosis not present

## 2011-10-21 DIAGNOSIS — D509 Iron deficiency anemia, unspecified: Secondary | ICD-10-CM | POA: Diagnosis not present

## 2011-10-21 DIAGNOSIS — N039 Chronic nephritic syndrome with unspecified morphologic changes: Secondary | ICD-10-CM | POA: Diagnosis not present

## 2011-10-21 DIAGNOSIS — D631 Anemia in chronic kidney disease: Secondary | ICD-10-CM | POA: Diagnosis not present

## 2011-10-25 DIAGNOSIS — N039 Chronic nephritic syndrome with unspecified morphologic changes: Secondary | ICD-10-CM | POA: Diagnosis not present

## 2011-10-25 DIAGNOSIS — D631 Anemia in chronic kidney disease: Secondary | ICD-10-CM | POA: Diagnosis not present

## 2011-10-25 DIAGNOSIS — D509 Iron deficiency anemia, unspecified: Secondary | ICD-10-CM | POA: Diagnosis not present

## 2011-10-25 DIAGNOSIS — N186 End stage renal disease: Secondary | ICD-10-CM | POA: Diagnosis not present

## 2011-10-27 DIAGNOSIS — N186 End stage renal disease: Secondary | ICD-10-CM | POA: Diagnosis not present

## 2011-10-27 DIAGNOSIS — N039 Chronic nephritic syndrome with unspecified morphologic changes: Secondary | ICD-10-CM | POA: Diagnosis not present

## 2011-10-27 DIAGNOSIS — D631 Anemia in chronic kidney disease: Secondary | ICD-10-CM | POA: Diagnosis not present

## 2011-10-27 DIAGNOSIS — D509 Iron deficiency anemia, unspecified: Secondary | ICD-10-CM | POA: Diagnosis not present

## 2011-10-29 DIAGNOSIS — D509 Iron deficiency anemia, unspecified: Secondary | ICD-10-CM | POA: Diagnosis not present

## 2011-10-29 DIAGNOSIS — N039 Chronic nephritic syndrome with unspecified morphologic changes: Secondary | ICD-10-CM | POA: Diagnosis not present

## 2011-10-29 DIAGNOSIS — D631 Anemia in chronic kidney disease: Secondary | ICD-10-CM | POA: Diagnosis not present

## 2011-10-29 DIAGNOSIS — N186 End stage renal disease: Secondary | ICD-10-CM | POA: Diagnosis not present

## 2011-11-01 DIAGNOSIS — N186 End stage renal disease: Secondary | ICD-10-CM | POA: Diagnosis not present

## 2011-11-01 DIAGNOSIS — D509 Iron deficiency anemia, unspecified: Secondary | ICD-10-CM | POA: Diagnosis not present

## 2011-11-01 DIAGNOSIS — N039 Chronic nephritic syndrome with unspecified morphologic changes: Secondary | ICD-10-CM | POA: Diagnosis not present

## 2011-11-01 DIAGNOSIS — D631 Anemia in chronic kidney disease: Secondary | ICD-10-CM | POA: Diagnosis not present

## 2011-11-03 DIAGNOSIS — D631 Anemia in chronic kidney disease: Secondary | ICD-10-CM | POA: Diagnosis not present

## 2011-11-03 DIAGNOSIS — D509 Iron deficiency anemia, unspecified: Secondary | ICD-10-CM | POA: Diagnosis not present

## 2011-11-03 DIAGNOSIS — N039 Chronic nephritic syndrome with unspecified morphologic changes: Secondary | ICD-10-CM | POA: Diagnosis not present

## 2011-11-03 DIAGNOSIS — N186 End stage renal disease: Secondary | ICD-10-CM | POA: Diagnosis not present

## 2011-11-05 DIAGNOSIS — D631 Anemia in chronic kidney disease: Secondary | ICD-10-CM | POA: Diagnosis not present

## 2011-11-05 DIAGNOSIS — D509 Iron deficiency anemia, unspecified: Secondary | ICD-10-CM | POA: Diagnosis not present

## 2011-11-05 DIAGNOSIS — N186 End stage renal disease: Secondary | ICD-10-CM | POA: Diagnosis not present

## 2011-11-08 DIAGNOSIS — D509 Iron deficiency anemia, unspecified: Secondary | ICD-10-CM | POA: Diagnosis not present

## 2011-11-08 DIAGNOSIS — N186 End stage renal disease: Secondary | ICD-10-CM | POA: Diagnosis not present

## 2011-11-08 DIAGNOSIS — D631 Anemia in chronic kidney disease: Secondary | ICD-10-CM | POA: Diagnosis not present

## 2011-11-10 DIAGNOSIS — D631 Anemia in chronic kidney disease: Secondary | ICD-10-CM | POA: Diagnosis not present

## 2011-11-10 DIAGNOSIS — D509 Iron deficiency anemia, unspecified: Secondary | ICD-10-CM | POA: Diagnosis not present

## 2011-11-10 DIAGNOSIS — N186 End stage renal disease: Secondary | ICD-10-CM | POA: Diagnosis not present

## 2011-11-15 DIAGNOSIS — D631 Anemia in chronic kidney disease: Secondary | ICD-10-CM | POA: Diagnosis not present

## 2011-11-15 DIAGNOSIS — D509 Iron deficiency anemia, unspecified: Secondary | ICD-10-CM | POA: Diagnosis not present

## 2011-11-15 DIAGNOSIS — N2581 Secondary hyperparathyroidism of renal origin: Secondary | ICD-10-CM | POA: Diagnosis not present

## 2011-11-15 DIAGNOSIS — N186 End stage renal disease: Secondary | ICD-10-CM | POA: Diagnosis not present

## 2011-12-11 DIAGNOSIS — N186 End stage renal disease: Secondary | ICD-10-CM | POA: Diagnosis not present

## 2011-12-13 DIAGNOSIS — N2581 Secondary hyperparathyroidism of renal origin: Secondary | ICD-10-CM | POA: Diagnosis not present

## 2011-12-13 DIAGNOSIS — D509 Iron deficiency anemia, unspecified: Secondary | ICD-10-CM | POA: Diagnosis not present

## 2011-12-13 DIAGNOSIS — N186 End stage renal disease: Secondary | ICD-10-CM | POA: Diagnosis not present

## 2011-12-13 DIAGNOSIS — D631 Anemia in chronic kidney disease: Secondary | ICD-10-CM | POA: Diagnosis not present

## 2012-01-10 DIAGNOSIS — N186 End stage renal disease: Secondary | ICD-10-CM | POA: Diagnosis not present

## 2012-01-12 DIAGNOSIS — N186 End stage renal disease: Secondary | ICD-10-CM | POA: Diagnosis not present

## 2012-01-12 DIAGNOSIS — E875 Hyperkalemia: Secondary | ICD-10-CM | POA: Diagnosis not present

## 2012-01-12 DIAGNOSIS — N2581 Secondary hyperparathyroidism of renal origin: Secondary | ICD-10-CM | POA: Diagnosis not present

## 2012-01-12 DIAGNOSIS — D509 Iron deficiency anemia, unspecified: Secondary | ICD-10-CM | POA: Diagnosis not present

## 2012-01-12 DIAGNOSIS — D631 Anemia in chronic kidney disease: Secondary | ICD-10-CM | POA: Diagnosis not present

## 2012-01-17 DIAGNOSIS — N186 End stage renal disease: Secondary | ICD-10-CM | POA: Diagnosis not present

## 2012-01-17 DIAGNOSIS — D631 Anemia in chronic kidney disease: Secondary | ICD-10-CM | POA: Diagnosis not present

## 2012-01-17 DIAGNOSIS — N039 Chronic nephritic syndrome with unspecified morphologic changes: Secondary | ICD-10-CM | POA: Diagnosis not present

## 2012-01-17 DIAGNOSIS — E875 Hyperkalemia: Secondary | ICD-10-CM | POA: Diagnosis not present

## 2012-01-17 DIAGNOSIS — D509 Iron deficiency anemia, unspecified: Secondary | ICD-10-CM | POA: Diagnosis not present

## 2012-01-17 DIAGNOSIS — N2581 Secondary hyperparathyroidism of renal origin: Secondary | ICD-10-CM | POA: Diagnosis not present

## 2012-01-19 DIAGNOSIS — N2581 Secondary hyperparathyroidism of renal origin: Secondary | ICD-10-CM | POA: Diagnosis not present

## 2012-01-19 DIAGNOSIS — E875 Hyperkalemia: Secondary | ICD-10-CM | POA: Diagnosis not present

## 2012-01-19 DIAGNOSIS — N186 End stage renal disease: Secondary | ICD-10-CM | POA: Diagnosis not present

## 2012-01-19 DIAGNOSIS — D509 Iron deficiency anemia, unspecified: Secondary | ICD-10-CM | POA: Diagnosis not present

## 2012-01-19 DIAGNOSIS — D631 Anemia in chronic kidney disease: Secondary | ICD-10-CM | POA: Diagnosis not present

## 2012-01-21 DIAGNOSIS — E875 Hyperkalemia: Secondary | ICD-10-CM | POA: Diagnosis not present

## 2012-01-21 DIAGNOSIS — N039 Chronic nephritic syndrome with unspecified morphologic changes: Secondary | ICD-10-CM | POA: Diagnosis not present

## 2012-01-21 DIAGNOSIS — D631 Anemia in chronic kidney disease: Secondary | ICD-10-CM | POA: Diagnosis not present

## 2012-01-21 DIAGNOSIS — N186 End stage renal disease: Secondary | ICD-10-CM | POA: Diagnosis not present

## 2012-01-21 DIAGNOSIS — D509 Iron deficiency anemia, unspecified: Secondary | ICD-10-CM | POA: Diagnosis not present

## 2012-01-21 DIAGNOSIS — N2581 Secondary hyperparathyroidism of renal origin: Secondary | ICD-10-CM | POA: Diagnosis not present

## 2012-01-24 DIAGNOSIS — D631 Anemia in chronic kidney disease: Secondary | ICD-10-CM | POA: Diagnosis not present

## 2012-01-24 DIAGNOSIS — E875 Hyperkalemia: Secondary | ICD-10-CM | POA: Diagnosis not present

## 2012-01-24 DIAGNOSIS — D509 Iron deficiency anemia, unspecified: Secondary | ICD-10-CM | POA: Diagnosis not present

## 2012-01-24 DIAGNOSIS — N186 End stage renal disease: Secondary | ICD-10-CM | POA: Diagnosis not present

## 2012-01-24 DIAGNOSIS — N2581 Secondary hyperparathyroidism of renal origin: Secondary | ICD-10-CM | POA: Diagnosis not present

## 2012-01-28 ENCOUNTER — Emergency Department (HOSPITAL_COMMUNITY)
Admission: EM | Admit: 2012-01-28 | Discharge: 2012-01-28 | Disposition: A | Discharge status: Home / Self Care | Payer: Medicare Other | Attending: Emergency Medicine | Admitting: Emergency Medicine

## 2012-01-28 ENCOUNTER — Encounter (HOSPITAL_COMMUNITY): Payer: Self-pay | Admitting: Emergency Medicine

## 2012-01-28 DIAGNOSIS — H5710 Ocular pain, unspecified eye: Secondary | ICD-10-CM | POA: Insufficient documentation

## 2012-01-28 DIAGNOSIS — H538 Other visual disturbances: Secondary | ICD-10-CM | POA: Insufficient documentation

## 2012-01-28 DIAGNOSIS — D509 Iron deficiency anemia, unspecified: Secondary | ICD-10-CM | POA: Diagnosis not present

## 2012-01-28 DIAGNOSIS — Z992 Dependence on renal dialysis: Secondary | ICD-10-CM | POA: Diagnosis not present

## 2012-01-28 DIAGNOSIS — D631 Anemia in chronic kidney disease: Secondary | ICD-10-CM | POA: Diagnosis not present

## 2012-01-28 DIAGNOSIS — N189 Chronic kidney disease, unspecified: Secondary | ICD-10-CM | POA: Insufficient documentation

## 2012-01-28 DIAGNOSIS — I129 Hypertensive chronic kidney disease with stage 1 through stage 4 chronic kidney disease, or unspecified chronic kidney disease: Secondary | ICD-10-CM | POA: Insufficient documentation

## 2012-01-28 DIAGNOSIS — N2581 Secondary hyperparathyroidism of renal origin: Secondary | ICD-10-CM | POA: Diagnosis not present

## 2012-01-28 DIAGNOSIS — F17200 Nicotine dependence, unspecified, uncomplicated: Secondary | ICD-10-CM | POA: Insufficient documentation

## 2012-01-28 DIAGNOSIS — N19 Unspecified kidney failure: Secondary | ICD-10-CM | POA: Insufficient documentation

## 2012-01-28 DIAGNOSIS — E875 Hyperkalemia: Secondary | ICD-10-CM | POA: Diagnosis not present

## 2012-01-28 DIAGNOSIS — N186 End stage renal disease: Secondary | ICD-10-CM | POA: Diagnosis not present

## 2012-01-28 HISTORY — DX: Essential (primary) hypertension: I10

## 2012-01-28 HISTORY — DX: Unspecified kidney failure: N19

## 2012-01-28 HISTORY — DX: Dependence on renal dialysis: Z99.2

## 2012-01-28 NOTE — ED Provider Notes (Signed)
History     CSN: [id number]  Arrival date & time 01/28/12  1739   First MD Initiated Contact with Patient 01/28/12 1748      Chief Complaint  Patient presents with  . Blurred Vision    (Consider location/radiation/quality/duration/timing/severity/associated sxs/prior treatment) HPI Comments: Hx of vitreous hemorrhage.  Blurry vision in right eye since last night.  No pain.  Otherwise doing well.  Patient is a 36 y.o. male presenting with eye problem. The history is provided by the patient.  Eye Problem  This is a new problem. Episode onset: last night. The problem occurs constantly. The problem has not changed since onset.There is pain in the right eye. There was no injury mechanism. The patient is experiencing no pain. There is no history of trauma to the eye. There is no known exposure to pink eye. Associated symptoms include blurred vision (right). Pertinent negatives include no numbness, no foreign body sensation, no photophobia, no eye redness, no nausea, no vomiting, no tingling and no weakness.    Past Medical History  Diagnosis Date  . Hypertension   . Kidney failure   . Dialysis patient     History reviewed. No pertinent past surgical history.  History reviewed. No pertinent family history.  History  Substance Use Topics  . Smoking status: Current Everyday Smoker -- 0.5 packs/day  . Smokeless tobacco: Not on file  . Alcohol Use: No      Review of Systems  Constitutional: Negative for fever, activity change and fatigue.  HENT: Negative for congestion.   Eyes: Positive for blurred vision (right). Negative for photophobia, pain and redness.  Respiratory: Negative for chest tightness, shortness of breath, wheezing and stridor.   Cardiovascular: Negative for chest pain and leg swelling.  Gastrointestinal: Negative for nausea and vomiting.  Genitourinary: Negative for dysuria.  Musculoskeletal: Negative for arthralgias.  Skin: Negative for rash.  Neurological:  Negative for tingling, weakness, numbness and headaches.  Psychiatric/Behavioral: Negative for behavioral problems.    Allergies  Review of patient's allergies indicates no known allergies.  Home Medications   Current Outpatient Rx  Name Route Sig Dispense Refill  . CALCIUM ACETATE 667 MG PO CAPS Oral Take 667 mg by mouth 3 (three) times daily with meals.    Marland Kitchen LABETALOL HCL 200 MG PO TABS Oral Take 200 mg by mouth 2 (two) times daily.    Marland Kitchen LISINOPRIL 20 MG PO TABS Oral Take 20 mg by mouth daily.      BP 142/92  Pulse 103  Temp(Src) 98 F (36.7 C) (Oral)  Resp 16  SpO2 98%  Physical Exam  Constitutional: He is oriented to person, place, and time. He appears well-developed and well-nourished. No distress.  HENT:  Head: Normocephalic and atraumatic.  Eyes: Conjunctivae and EOM are normal. Pupils are equal, round, and reactive to light. No scleral icterus.       Loss of inferior visual fields in right eye.  20/30 vision in right eye.  20/20 in left.  Neck: Normal range of motion. Neck supple.  Cardiovascular: Normal rate and regular rhythm.  Exam reveals no gallop and no friction rub.   No murmur heard. Pulmonary/Chest: Effort normal and breath sounds normal. No respiratory distress. He has no wheezes. He has no rales. He exhibits no tenderness.  Abdominal: Soft. He exhibits no distension and no mass. There is no tenderness. There is no rebound and no guarding.  Musculoskeletal: Normal range of motion. He exhibits no edema and no tenderness.  Neurological:  He is alert and oriented to person, place, and time. He has normal reflexes. No cranial nerve deficit. He exhibits normal muscle tone. Coordination normal.  Skin: Skin is warm and dry. No rash noted. He is not diaphoretic. No erythema.  Psychiatric: He has a normal mood and affect. His behavior is normal. Judgment and thought content normal.    ED Course  Procedures (including critical care time)  Labs Reviewed - No data to  display No results found.   1. Blurry vision, right eye       MDM  Hx of vitreous hemorrhage.  Blurry vision in right eye since last night.  No pain.  Otherwise doing well.  Loss of inferior visual fields on right side.  Unable to visualize fundus on fundoscopic exam.  Concern for vitreous hemorrhage, retinal detachment, posterior vitreous detachment.  Exam not c/w stroke, glaucoma.  D/w ophtho - Dr. Gwen Pounds - recommended f/u in clinic on Monday.  Pt to return if symptoms worsen.  Pt was dialyzed today - no reason to suspect hyperkalemia at this time.        Army Chaco, MD 01/28/12 [phone]

## 2012-01-28 NOTE — ED Notes (Signed)
Patient complaining of blurred vision in right eye that started last night; denies injury.  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  Patient denies weakness; no facial droop present.  Grips bilaterally equal and strong. Patient dialysis patient; Tue/Thur/Sat.

## 2012-01-28 NOTE — Discharge Instructions (Signed)
Blurred Vision You have been seen today complaining of blurred vision. This means you have a loss of ability to see small details.  CAUSES  Blurred vision can be a symptom of underlying eye problems, such as:  Aging of the eye (presbyopia).   Glaucoma.   Cataracts.   Eye infection.   Eye-related migraine.   Diabetes mellitus.   Fatigue.   Migraine headaches.   High blood pressure.   Breakdown of the back of the eye (macular degeneration).   Problems caused by some medications.  The most common cause of blurred vision is the need for eyeglasses or a new prescription. Today in the emergency department, no cause for your blurred vision can be found. SYMPTOMS  Blurred vision is the loss of visual sharpness and detail (acuity). DIAGNOSIS  Should blurred vision continue, you should see your caregiver. If your caregiver is your primary care physician, he or she may choose to refer you to another specialist.  TREATMENT  Do not ignore your blurred vision. Make sure to have it checked out to see if further treatment or referral is necessary. SEEK MEDICAL CARE IF:  You are unable to get into a specialist so we can help you with a referral. SEEK IMMEDIATE MEDICAL CARE IF: You have severe eye pain, severe headache, or sudden loss of vision. MAKE SURE YOU:   Understand these instructions.   Will watch your condition.   Will get help right away if you are not doing well or get worse.  Document Released: 09/01/2003 Document Revised: 08/18/2011 Document Reviewed: 04/02/2008 [employer] Patient Information 2012 [employer]. 

## 2012-01-28 NOTE — ED Provider Notes (Signed)
I saw and evaluated the patient, reviewed the resident's note and I agree with the findings and plan.   Monda Chastain, MD 01/28/12 2128 

## 2012-01-30 DIAGNOSIS — D631 Anemia in chronic kidney disease: Secondary | ICD-10-CM | POA: Diagnosis not present

## 2012-01-30 DIAGNOSIS — N2581 Secondary hyperparathyroidism of renal origin: Secondary | ICD-10-CM | POA: Diagnosis not present

## 2012-01-30 DIAGNOSIS — N186 End stage renal disease: Secondary | ICD-10-CM | POA: Diagnosis not present

## 2012-01-30 DIAGNOSIS — E875 Hyperkalemia: Secondary | ICD-10-CM | POA: Diagnosis not present

## 2012-01-30 DIAGNOSIS — N039 Chronic nephritic syndrome with unspecified morphologic changes: Secondary | ICD-10-CM | POA: Diagnosis not present

## 2012-01-30 DIAGNOSIS — D509 Iron deficiency anemia, unspecified: Secondary | ICD-10-CM | POA: Diagnosis not present

## 2012-01-31 DIAGNOSIS — H47019 Ischemic optic neuropathy, unspecified eye: Secondary | ICD-10-CM | POA: Diagnosis not present

## 2012-01-31 DIAGNOSIS — H5340 Unspecified visual field defects: Secondary | ICD-10-CM | POA: Diagnosis not present

## 2012-01-31 DIAGNOSIS — H3581 Retinal edema: Secondary | ICD-10-CM | POA: Diagnosis not present

## 2012-01-31 DIAGNOSIS — H35039 Hypertensive retinopathy, unspecified eye: Secondary | ICD-10-CM | POA: Diagnosis not present

## 2012-02-02 DIAGNOSIS — H5340 Unspecified visual field defects: Secondary | ICD-10-CM | POA: Diagnosis not present

## 2012-02-02 DIAGNOSIS — H47019 Ischemic optic neuropathy, unspecified eye: Secondary | ICD-10-CM | POA: Diagnosis not present

## 2012-02-02 DIAGNOSIS — H3581 Retinal edema: Secondary | ICD-10-CM | POA: Diagnosis not present

## 2012-02-03 DIAGNOSIS — R0989 Other specified symptoms and signs involving the circulatory and respiratory systems: Secondary | ICD-10-CM | POA: Diagnosis not present

## 2012-02-03 DIAGNOSIS — R5383 Other fatigue: Secondary | ICD-10-CM | POA: Diagnosis not present

## 2012-02-03 DIAGNOSIS — E875 Hyperkalemia: Secondary | ICD-10-CM | POA: Diagnosis not present

## 2012-02-03 DIAGNOSIS — N2581 Secondary hyperparathyroidism of renal origin: Secondary | ICD-10-CM | POA: Diagnosis not present

## 2012-02-03 DIAGNOSIS — N189 Chronic kidney disease, unspecified: Secondary | ICD-10-CM | POA: Diagnosis not present

## 2012-02-03 DIAGNOSIS — Z940 Kidney transplant status: Secondary | ICD-10-CM | POA: Diagnosis not present

## 2012-02-03 DIAGNOSIS — D509 Iron deficiency anemia, unspecified: Secondary | ICD-10-CM | POA: Diagnosis not present

## 2012-02-03 DIAGNOSIS — R0609 Other forms of dyspnea: Secondary | ICD-10-CM | POA: Diagnosis not present

## 2012-02-03 DIAGNOSIS — D649 Anemia, unspecified: Secondary | ICD-10-CM | POA: Diagnosis not present

## 2012-02-03 DIAGNOSIS — R404 Transient alteration of awareness: Secondary | ICD-10-CM | POA: Diagnosis not present

## 2012-02-03 DIAGNOSIS — R5381 Other malaise: Secondary | ICD-10-CM | POA: Diagnosis not present

## 2012-02-03 DIAGNOSIS — H47019 Ischemic optic neuropathy, unspecified eye: Secondary | ICD-10-CM | POA: Diagnosis not present

## 2012-02-03 DIAGNOSIS — I129 Hypertensive chronic kidney disease with stage 1 through stage 4 chronic kidney disease, or unspecified chronic kidney disease: Secondary | ICD-10-CM | POA: Diagnosis not present

## 2012-02-03 DIAGNOSIS — Z992 Dependence on renal dialysis: Secondary | ICD-10-CM | POA: Diagnosis not present

## 2012-02-03 DIAGNOSIS — N186 End stage renal disease: Secondary | ICD-10-CM | POA: Diagnosis not present

## 2012-02-03 DIAGNOSIS — F17200 Nicotine dependence, unspecified, uncomplicated: Secondary | ICD-10-CM | POA: Diagnosis not present

## 2012-02-03 DIAGNOSIS — N039 Chronic nephritic syndrome with unspecified morphologic changes: Secondary | ICD-10-CM | POA: Diagnosis not present

## 2012-02-03 DIAGNOSIS — R0602 Shortness of breath: Secondary | ICD-10-CM | POA: Diagnosis not present

## 2012-02-03 DIAGNOSIS — D631 Anemia in chronic kidney disease: Secondary | ICD-10-CM | POA: Diagnosis not present

## 2012-02-03 DIAGNOSIS — R799 Abnormal finding of blood chemistry, unspecified: Secondary | ICD-10-CM | POA: Diagnosis not present

## 2012-02-04 DIAGNOSIS — E875 Hyperkalemia: Secondary | ICD-10-CM | POA: Diagnosis not present

## 2012-02-04 DIAGNOSIS — N039 Chronic nephritic syndrome with unspecified morphologic changes: Secondary | ICD-10-CM | POA: Diagnosis not present

## 2012-02-04 DIAGNOSIS — D631 Anemia in chronic kidney disease: Secondary | ICD-10-CM | POA: Diagnosis not present

## 2012-02-04 DIAGNOSIS — N186 End stage renal disease: Secondary | ICD-10-CM | POA: Diagnosis not present

## 2012-02-04 DIAGNOSIS — D509 Iron deficiency anemia, unspecified: Secondary | ICD-10-CM | POA: Diagnosis not present

## 2012-02-04 DIAGNOSIS — N2581 Secondary hyperparathyroidism of renal origin: Secondary | ICD-10-CM | POA: Diagnosis not present

## 2012-02-06 DIAGNOSIS — N2581 Secondary hyperparathyroidism of renal origin: Secondary | ICD-10-CM | POA: Diagnosis not present

## 2012-02-06 DIAGNOSIS — N186 End stage renal disease: Secondary | ICD-10-CM | POA: Diagnosis not present

## 2012-02-06 DIAGNOSIS — D509 Iron deficiency anemia, unspecified: Secondary | ICD-10-CM | POA: Diagnosis not present

## 2012-02-06 DIAGNOSIS — E875 Hyperkalemia: Secondary | ICD-10-CM | POA: Diagnosis not present

## 2012-02-06 DIAGNOSIS — N039 Chronic nephritic syndrome with unspecified morphologic changes: Secondary | ICD-10-CM | POA: Diagnosis not present

## 2012-02-06 DIAGNOSIS — N189 Chronic kidney disease, unspecified: Secondary | ICD-10-CM | POA: Diagnosis not present

## 2012-02-07 DIAGNOSIS — N189 Chronic kidney disease, unspecified: Secondary | ICD-10-CM | POA: Diagnosis not present

## 2012-02-07 DIAGNOSIS — D631 Anemia in chronic kidney disease: Secondary | ICD-10-CM | POA: Diagnosis not present

## 2012-02-08 DIAGNOSIS — N039 Chronic nephritic syndrome with unspecified morphologic changes: Secondary | ICD-10-CM | POA: Diagnosis not present

## 2012-02-08 DIAGNOSIS — H47019 Ischemic optic neuropathy, unspecified eye: Secondary | ICD-10-CM | POA: Diagnosis not present

## 2012-02-08 DIAGNOSIS — D509 Iron deficiency anemia, unspecified: Secondary | ICD-10-CM | POA: Diagnosis not present

## 2012-02-08 DIAGNOSIS — D631 Anemia in chronic kidney disease: Secondary | ICD-10-CM | POA: Diagnosis not present

## 2012-02-08 DIAGNOSIS — N2581 Secondary hyperparathyroidism of renal origin: Secondary | ICD-10-CM | POA: Diagnosis not present

## 2012-02-08 DIAGNOSIS — N186 End stage renal disease: Secondary | ICD-10-CM | POA: Diagnosis not present

## 2012-02-08 DIAGNOSIS — E875 Hyperkalemia: Secondary | ICD-10-CM | POA: Diagnosis not present

## 2012-02-10 DIAGNOSIS — N186 End stage renal disease: Secondary | ICD-10-CM | POA: Diagnosis not present

## 2012-02-11 DIAGNOSIS — N039 Chronic nephritic syndrome with unspecified morphologic changes: Secondary | ICD-10-CM | POA: Diagnosis not present

## 2012-02-11 DIAGNOSIS — N186 End stage renal disease: Secondary | ICD-10-CM | POA: Diagnosis not present

## 2012-02-11 DIAGNOSIS — N2581 Secondary hyperparathyroidism of renal origin: Secondary | ICD-10-CM | POA: Diagnosis not present

## 2012-02-11 DIAGNOSIS — D631 Anemia in chronic kidney disease: Secondary | ICD-10-CM | POA: Diagnosis not present

## 2012-02-11 DIAGNOSIS — D509 Iron deficiency anemia, unspecified: Secondary | ICD-10-CM | POA: Diagnosis not present

## 2012-02-13 DIAGNOSIS — D631 Anemia in chronic kidney disease: Secondary | ICD-10-CM | POA: Diagnosis not present

## 2012-02-13 DIAGNOSIS — N2581 Secondary hyperparathyroidism of renal origin: Secondary | ICD-10-CM | POA: Diagnosis not present

## 2012-02-13 DIAGNOSIS — N186 End stage renal disease: Secondary | ICD-10-CM | POA: Diagnosis not present

## 2012-02-13 DIAGNOSIS — D509 Iron deficiency anemia, unspecified: Secondary | ICD-10-CM | POA: Diagnosis not present

## 2012-02-14 DIAGNOSIS — H5340 Unspecified visual field defects: Secondary | ICD-10-CM | POA: Diagnosis not present

## 2012-02-14 DIAGNOSIS — H47019 Ischemic optic neuropathy, unspecified eye: Secondary | ICD-10-CM | POA: Diagnosis not present

## 2012-02-14 DIAGNOSIS — H3581 Retinal edema: Secondary | ICD-10-CM | POA: Diagnosis not present

## 2012-02-16 DIAGNOSIS — D631 Anemia in chronic kidney disease: Secondary | ICD-10-CM | POA: Diagnosis not present

## 2012-02-16 DIAGNOSIS — N186 End stage renal disease: Secondary | ICD-10-CM | POA: Diagnosis not present

## 2012-02-16 DIAGNOSIS — D509 Iron deficiency anemia, unspecified: Secondary | ICD-10-CM | POA: Diagnosis not present

## 2012-02-16 DIAGNOSIS — N2581 Secondary hyperparathyroidism of renal origin: Secondary | ICD-10-CM | POA: Diagnosis not present

## 2012-02-16 DIAGNOSIS — N039 Chronic nephritic syndrome with unspecified morphologic changes: Secondary | ICD-10-CM | POA: Diagnosis not present

## 2012-02-18 DIAGNOSIS — D631 Anemia in chronic kidney disease: Secondary | ICD-10-CM | POA: Diagnosis not present

## 2012-02-18 DIAGNOSIS — D509 Iron deficiency anemia, unspecified: Secondary | ICD-10-CM | POA: Diagnosis not present

## 2012-02-18 DIAGNOSIS — N186 End stage renal disease: Secondary | ICD-10-CM | POA: Diagnosis not present

## 2012-02-18 DIAGNOSIS — N2581 Secondary hyperparathyroidism of renal origin: Secondary | ICD-10-CM | POA: Diagnosis not present

## 2012-02-21 DIAGNOSIS — N2581 Secondary hyperparathyroidism of renal origin: Secondary | ICD-10-CM | POA: Diagnosis not present

## 2012-02-21 DIAGNOSIS — D631 Anemia in chronic kidney disease: Secondary | ICD-10-CM | POA: Diagnosis not present

## 2012-02-21 DIAGNOSIS — D509 Iron deficiency anemia, unspecified: Secondary | ICD-10-CM | POA: Diagnosis not present

## 2012-02-21 DIAGNOSIS — N186 End stage renal disease: Secondary | ICD-10-CM | POA: Diagnosis not present

## 2012-02-23 DIAGNOSIS — N186 End stage renal disease: Secondary | ICD-10-CM | POA: Diagnosis not present

## 2012-02-23 DIAGNOSIS — D509 Iron deficiency anemia, unspecified: Secondary | ICD-10-CM | POA: Diagnosis not present

## 2012-02-23 DIAGNOSIS — N039 Chronic nephritic syndrome with unspecified morphologic changes: Secondary | ICD-10-CM | POA: Diagnosis not present

## 2012-02-23 DIAGNOSIS — D631 Anemia in chronic kidney disease: Secondary | ICD-10-CM | POA: Diagnosis not present

## 2012-02-23 DIAGNOSIS — N2581 Secondary hyperparathyroidism of renal origin: Secondary | ICD-10-CM | POA: Diagnosis not present

## 2012-02-25 DIAGNOSIS — D509 Iron deficiency anemia, unspecified: Secondary | ICD-10-CM | POA: Diagnosis not present

## 2012-02-25 DIAGNOSIS — N2581 Secondary hyperparathyroidism of renal origin: Secondary | ICD-10-CM | POA: Diagnosis not present

## 2012-02-25 DIAGNOSIS — N186 End stage renal disease: Secondary | ICD-10-CM | POA: Diagnosis not present

## 2012-02-25 DIAGNOSIS — N039 Chronic nephritic syndrome with unspecified morphologic changes: Secondary | ICD-10-CM | POA: Diagnosis not present

## 2012-02-28 DIAGNOSIS — N186 End stage renal disease: Secondary | ICD-10-CM | POA: Diagnosis not present

## 2012-02-28 DIAGNOSIS — N2581 Secondary hyperparathyroidism of renal origin: Secondary | ICD-10-CM | POA: Diagnosis not present

## 2012-02-28 DIAGNOSIS — D509 Iron deficiency anemia, unspecified: Secondary | ICD-10-CM | POA: Diagnosis not present

## 2012-02-28 DIAGNOSIS — D631 Anemia in chronic kidney disease: Secondary | ICD-10-CM | POA: Diagnosis not present

## 2012-03-01 DIAGNOSIS — N2581 Secondary hyperparathyroidism of renal origin: Secondary | ICD-10-CM | POA: Diagnosis not present

## 2012-03-01 DIAGNOSIS — D509 Iron deficiency anemia, unspecified: Secondary | ICD-10-CM | POA: Diagnosis not present

## 2012-03-01 DIAGNOSIS — N186 End stage renal disease: Secondary | ICD-10-CM | POA: Diagnosis not present

## 2012-03-01 DIAGNOSIS — D631 Anemia in chronic kidney disease: Secondary | ICD-10-CM | POA: Diagnosis not present

## 2012-03-03 DIAGNOSIS — N2581 Secondary hyperparathyroidism of renal origin: Secondary | ICD-10-CM | POA: Diagnosis not present

## 2012-03-03 DIAGNOSIS — D509 Iron deficiency anemia, unspecified: Secondary | ICD-10-CM | POA: Diagnosis not present

## 2012-03-03 DIAGNOSIS — N186 End stage renal disease: Secondary | ICD-10-CM | POA: Diagnosis not present

## 2012-03-03 DIAGNOSIS — D631 Anemia in chronic kidney disease: Secondary | ICD-10-CM | POA: Diagnosis not present

## 2012-03-03 DIAGNOSIS — N039 Chronic nephritic syndrome with unspecified morphologic changes: Secondary | ICD-10-CM | POA: Diagnosis not present

## 2012-03-06 DIAGNOSIS — N186 End stage renal disease: Secondary | ICD-10-CM | POA: Diagnosis not present

## 2012-03-06 DIAGNOSIS — D631 Anemia in chronic kidney disease: Secondary | ICD-10-CM | POA: Diagnosis not present

## 2012-03-06 DIAGNOSIS — N039 Chronic nephritic syndrome with unspecified morphologic changes: Secondary | ICD-10-CM | POA: Diagnosis not present

## 2012-03-06 DIAGNOSIS — N2581 Secondary hyperparathyroidism of renal origin: Secondary | ICD-10-CM | POA: Diagnosis not present

## 2012-03-06 DIAGNOSIS — D509 Iron deficiency anemia, unspecified: Secondary | ICD-10-CM | POA: Diagnosis not present

## 2012-03-08 DIAGNOSIS — D631 Anemia in chronic kidney disease: Secondary | ICD-10-CM | POA: Diagnosis not present

## 2012-03-08 DIAGNOSIS — N186 End stage renal disease: Secondary | ICD-10-CM | POA: Diagnosis not present

## 2012-03-08 DIAGNOSIS — N2581 Secondary hyperparathyroidism of renal origin: Secondary | ICD-10-CM | POA: Diagnosis not present

## 2012-03-08 DIAGNOSIS — N039 Chronic nephritic syndrome with unspecified morphologic changes: Secondary | ICD-10-CM | POA: Diagnosis not present

## 2012-03-08 DIAGNOSIS — D509 Iron deficiency anemia, unspecified: Secondary | ICD-10-CM | POA: Diagnosis not present

## 2012-03-10 DIAGNOSIS — D509 Iron deficiency anemia, unspecified: Secondary | ICD-10-CM | POA: Diagnosis not present

## 2012-03-10 DIAGNOSIS — N2581 Secondary hyperparathyroidism of renal origin: Secondary | ICD-10-CM | POA: Diagnosis not present

## 2012-03-10 DIAGNOSIS — N186 End stage renal disease: Secondary | ICD-10-CM | POA: Diagnosis not present

## 2012-03-10 DIAGNOSIS — N039 Chronic nephritic syndrome with unspecified morphologic changes: Secondary | ICD-10-CM | POA: Diagnosis not present

## 2012-03-11 DIAGNOSIS — N186 End stage renal disease: Secondary | ICD-10-CM | POA: Diagnosis not present

## 2012-03-13 DIAGNOSIS — D509 Iron deficiency anemia, unspecified: Secondary | ICD-10-CM | POA: Diagnosis not present

## 2012-03-13 DIAGNOSIS — N186 End stage renal disease: Secondary | ICD-10-CM | POA: Diagnosis not present

## 2012-03-13 DIAGNOSIS — N2581 Secondary hyperparathyroidism of renal origin: Secondary | ICD-10-CM | POA: Diagnosis not present

## 2012-03-15 DIAGNOSIS — N186 End stage renal disease: Secondary | ICD-10-CM | POA: Diagnosis not present

## 2012-03-15 DIAGNOSIS — D509 Iron deficiency anemia, unspecified: Secondary | ICD-10-CM | POA: Diagnosis not present

## 2012-03-15 DIAGNOSIS — N2581 Secondary hyperparathyroidism of renal origin: Secondary | ICD-10-CM | POA: Diagnosis not present

## 2012-03-17 DIAGNOSIS — N186 End stage renal disease: Secondary | ICD-10-CM | POA: Diagnosis not present

## 2012-03-17 DIAGNOSIS — D509 Iron deficiency anemia, unspecified: Secondary | ICD-10-CM | POA: Diagnosis not present

## 2012-03-17 DIAGNOSIS — N2581 Secondary hyperparathyroidism of renal origin: Secondary | ICD-10-CM | POA: Diagnosis not present

## 2012-03-20 DIAGNOSIS — N2581 Secondary hyperparathyroidism of renal origin: Secondary | ICD-10-CM | POA: Diagnosis not present

## 2012-03-20 DIAGNOSIS — N186 End stage renal disease: Secondary | ICD-10-CM | POA: Diagnosis not present

## 2012-03-20 DIAGNOSIS — D509 Iron deficiency anemia, unspecified: Secondary | ICD-10-CM | POA: Diagnosis not present

## 2012-03-22 DIAGNOSIS — D509 Iron deficiency anemia, unspecified: Secondary | ICD-10-CM | POA: Diagnosis not present

## 2012-03-22 DIAGNOSIS — N2581 Secondary hyperparathyroidism of renal origin: Secondary | ICD-10-CM | POA: Diagnosis not present

## 2012-03-22 DIAGNOSIS — N186 End stage renal disease: Secondary | ICD-10-CM | POA: Diagnosis not present

## 2012-03-22 DIAGNOSIS — C44319 Basal cell carcinoma of skin of other parts of face: Secondary | ICD-10-CM | POA: Diagnosis not present

## 2012-03-22 DIAGNOSIS — L570 Actinic keratosis: Secondary | ICD-10-CM | POA: Diagnosis not present

## 2012-03-24 DIAGNOSIS — N2581 Secondary hyperparathyroidism of renal origin: Secondary | ICD-10-CM | POA: Diagnosis not present

## 2012-03-24 DIAGNOSIS — N186 End stage renal disease: Secondary | ICD-10-CM | POA: Diagnosis not present

## 2012-03-24 DIAGNOSIS — D509 Iron deficiency anemia, unspecified: Secondary | ICD-10-CM | POA: Diagnosis not present

## 2012-03-27 DIAGNOSIS — D509 Iron deficiency anemia, unspecified: Secondary | ICD-10-CM | POA: Diagnosis not present

## 2012-03-27 DIAGNOSIS — N186 End stage renal disease: Secondary | ICD-10-CM | POA: Diagnosis not present

## 2012-03-27 DIAGNOSIS — N2581 Secondary hyperparathyroidism of renal origin: Secondary | ICD-10-CM | POA: Diagnosis not present

## 2012-03-29 DIAGNOSIS — D509 Iron deficiency anemia, unspecified: Secondary | ICD-10-CM | POA: Diagnosis not present

## 2012-03-29 DIAGNOSIS — N186 End stage renal disease: Secondary | ICD-10-CM | POA: Diagnosis not present

## 2012-03-29 DIAGNOSIS — N2581 Secondary hyperparathyroidism of renal origin: Secondary | ICD-10-CM | POA: Diagnosis not present

## 2012-04-03 DIAGNOSIS — D509 Iron deficiency anemia, unspecified: Secondary | ICD-10-CM | POA: Diagnosis not present

## 2012-04-03 DIAGNOSIS — N2581 Secondary hyperparathyroidism of renal origin: Secondary | ICD-10-CM | POA: Diagnosis not present

## 2012-04-03 DIAGNOSIS — N186 End stage renal disease: Secondary | ICD-10-CM | POA: Diagnosis not present

## 2012-04-05 DIAGNOSIS — N186 End stage renal disease: Secondary | ICD-10-CM | POA: Diagnosis not present

## 2012-04-05 DIAGNOSIS — N2581 Secondary hyperparathyroidism of renal origin: Secondary | ICD-10-CM | POA: Diagnosis not present

## 2012-04-05 DIAGNOSIS — D509 Iron deficiency anemia, unspecified: Secondary | ICD-10-CM | POA: Diagnosis not present

## 2012-04-07 DIAGNOSIS — N186 End stage renal disease: Secondary | ICD-10-CM | POA: Diagnosis not present

## 2012-04-07 DIAGNOSIS — N2581 Secondary hyperparathyroidism of renal origin: Secondary | ICD-10-CM | POA: Diagnosis not present

## 2012-04-07 DIAGNOSIS — D509 Iron deficiency anemia, unspecified: Secondary | ICD-10-CM | POA: Diagnosis not present

## 2012-04-10 DIAGNOSIS — N186 End stage renal disease: Secondary | ICD-10-CM | POA: Diagnosis not present

## 2012-04-10 DIAGNOSIS — N2581 Secondary hyperparathyroidism of renal origin: Secondary | ICD-10-CM | POA: Diagnosis not present

## 2012-04-10 DIAGNOSIS — D509 Iron deficiency anemia, unspecified: Secondary | ICD-10-CM | POA: Diagnosis not present

## 2012-04-11 DIAGNOSIS — N186 End stage renal disease: Secondary | ICD-10-CM | POA: Diagnosis not present

## 2012-04-12 DIAGNOSIS — N186 End stage renal disease: Secondary | ICD-10-CM | POA: Diagnosis not present

## 2012-04-12 DIAGNOSIS — D509 Iron deficiency anemia, unspecified: Secondary | ICD-10-CM | POA: Diagnosis not present

## 2012-04-12 DIAGNOSIS — N2581 Secondary hyperparathyroidism of renal origin: Secondary | ICD-10-CM | POA: Diagnosis not present

## 2012-04-14 DIAGNOSIS — D509 Iron deficiency anemia, unspecified: Secondary | ICD-10-CM | POA: Diagnosis not present

## 2012-04-14 DIAGNOSIS — N2581 Secondary hyperparathyroidism of renal origin: Secondary | ICD-10-CM | POA: Diagnosis not present

## 2012-04-14 DIAGNOSIS — N186 End stage renal disease: Secondary | ICD-10-CM | POA: Diagnosis not present

## 2012-04-17 DIAGNOSIS — N186 End stage renal disease: Secondary | ICD-10-CM | POA: Diagnosis not present

## 2012-04-17 DIAGNOSIS — N2581 Secondary hyperparathyroidism of renal origin: Secondary | ICD-10-CM | POA: Diagnosis not present

## 2012-04-17 DIAGNOSIS — D509 Iron deficiency anemia, unspecified: Secondary | ICD-10-CM | POA: Diagnosis not present

## 2012-04-19 DIAGNOSIS — N186 End stage renal disease: Secondary | ICD-10-CM | POA: Diagnosis not present

## 2012-04-19 DIAGNOSIS — N2581 Secondary hyperparathyroidism of renal origin: Secondary | ICD-10-CM | POA: Diagnosis not present

## 2012-04-19 DIAGNOSIS — D509 Iron deficiency anemia, unspecified: Secondary | ICD-10-CM | POA: Diagnosis not present

## 2012-04-21 DIAGNOSIS — N186 End stage renal disease: Secondary | ICD-10-CM | POA: Diagnosis not present

## 2012-04-21 DIAGNOSIS — D509 Iron deficiency anemia, unspecified: Secondary | ICD-10-CM | POA: Diagnosis not present

## 2012-04-21 DIAGNOSIS — N2581 Secondary hyperparathyroidism of renal origin: Secondary | ICD-10-CM | POA: Diagnosis not present

## 2012-04-24 DIAGNOSIS — D509 Iron deficiency anemia, unspecified: Secondary | ICD-10-CM | POA: Diagnosis not present

## 2012-04-24 DIAGNOSIS — N2581 Secondary hyperparathyroidism of renal origin: Secondary | ICD-10-CM | POA: Diagnosis not present

## 2012-04-24 DIAGNOSIS — N186 End stage renal disease: Secondary | ICD-10-CM | POA: Diagnosis not present

## 2012-04-26 DIAGNOSIS — N186 End stage renal disease: Secondary | ICD-10-CM | POA: Diagnosis not present

## 2012-04-26 DIAGNOSIS — D509 Iron deficiency anemia, unspecified: Secondary | ICD-10-CM | POA: Diagnosis not present

## 2012-04-26 DIAGNOSIS — N2581 Secondary hyperparathyroidism of renal origin: Secondary | ICD-10-CM | POA: Diagnosis not present

## 2012-05-01 DIAGNOSIS — N2581 Secondary hyperparathyroidism of renal origin: Secondary | ICD-10-CM | POA: Diagnosis not present

## 2012-05-01 DIAGNOSIS — D509 Iron deficiency anemia, unspecified: Secondary | ICD-10-CM | POA: Diagnosis not present

## 2012-05-01 DIAGNOSIS — N186 End stage renal disease: Secondary | ICD-10-CM | POA: Diagnosis not present

## 2012-05-03 DIAGNOSIS — D509 Iron deficiency anemia, unspecified: Secondary | ICD-10-CM | POA: Diagnosis not present

## 2012-05-03 DIAGNOSIS — N186 End stage renal disease: Secondary | ICD-10-CM | POA: Diagnosis not present

## 2012-05-03 DIAGNOSIS — N2581 Secondary hyperparathyroidism of renal origin: Secondary | ICD-10-CM | POA: Diagnosis not present

## 2012-05-05 DIAGNOSIS — D509 Iron deficiency anemia, unspecified: Secondary | ICD-10-CM | POA: Diagnosis not present

## 2012-05-05 DIAGNOSIS — N186 End stage renal disease: Secondary | ICD-10-CM | POA: Diagnosis not present

## 2012-05-05 DIAGNOSIS — N2581 Secondary hyperparathyroidism of renal origin: Secondary | ICD-10-CM | POA: Diagnosis not present

## 2012-05-10 DIAGNOSIS — D509 Iron deficiency anemia, unspecified: Secondary | ICD-10-CM | POA: Diagnosis not present

## 2012-05-10 DIAGNOSIS — N2581 Secondary hyperparathyroidism of renal origin: Secondary | ICD-10-CM | POA: Diagnosis not present

## 2012-05-10 DIAGNOSIS — N186 End stage renal disease: Secondary | ICD-10-CM | POA: Diagnosis not present

## 2012-05-12 DIAGNOSIS — N186 End stage renal disease: Secondary | ICD-10-CM | POA: Diagnosis not present

## 2012-05-12 DIAGNOSIS — N2581 Secondary hyperparathyroidism of renal origin: Secondary | ICD-10-CM | POA: Diagnosis not present

## 2012-05-12 DIAGNOSIS — D509 Iron deficiency anemia, unspecified: Secondary | ICD-10-CM | POA: Diagnosis not present

## 2012-05-17 DIAGNOSIS — N186 End stage renal disease: Secondary | ICD-10-CM | POA: Diagnosis not present

## 2012-05-17 DIAGNOSIS — D631 Anemia in chronic kidney disease: Secondary | ICD-10-CM | POA: Diagnosis not present

## 2012-05-17 DIAGNOSIS — N2581 Secondary hyperparathyroidism of renal origin: Secondary | ICD-10-CM | POA: Diagnosis not present

## 2012-05-17 DIAGNOSIS — D509 Iron deficiency anemia, unspecified: Secondary | ICD-10-CM | POA: Diagnosis not present

## 2012-05-19 DIAGNOSIS — N2581 Secondary hyperparathyroidism of renal origin: Secondary | ICD-10-CM | POA: Diagnosis not present

## 2012-05-19 DIAGNOSIS — N186 End stage renal disease: Secondary | ICD-10-CM | POA: Diagnosis not present

## 2012-05-19 DIAGNOSIS — D509 Iron deficiency anemia, unspecified: Secondary | ICD-10-CM | POA: Diagnosis not present

## 2012-05-19 DIAGNOSIS — D631 Anemia in chronic kidney disease: Secondary | ICD-10-CM | POA: Diagnosis not present

## 2012-05-22 DIAGNOSIS — D509 Iron deficiency anemia, unspecified: Secondary | ICD-10-CM | POA: Diagnosis not present

## 2012-05-22 DIAGNOSIS — D631 Anemia in chronic kidney disease: Secondary | ICD-10-CM | POA: Diagnosis not present

## 2012-05-22 DIAGNOSIS — N186 End stage renal disease: Secondary | ICD-10-CM | POA: Diagnosis not present

## 2012-05-22 DIAGNOSIS — N2581 Secondary hyperparathyroidism of renal origin: Secondary | ICD-10-CM | POA: Diagnosis not present

## 2012-05-24 DIAGNOSIS — D509 Iron deficiency anemia, unspecified: Secondary | ICD-10-CM | POA: Diagnosis not present

## 2012-05-24 DIAGNOSIS — N186 End stage renal disease: Secondary | ICD-10-CM | POA: Diagnosis not present

## 2012-05-24 DIAGNOSIS — N2581 Secondary hyperparathyroidism of renal origin: Secondary | ICD-10-CM | POA: Diagnosis not present

## 2012-05-24 DIAGNOSIS — D631 Anemia in chronic kidney disease: Secondary | ICD-10-CM | POA: Diagnosis not present

## 2012-05-26 DIAGNOSIS — D631 Anemia in chronic kidney disease: Secondary | ICD-10-CM | POA: Diagnosis not present

## 2012-05-26 DIAGNOSIS — N186 End stage renal disease: Secondary | ICD-10-CM | POA: Diagnosis not present

## 2012-05-26 DIAGNOSIS — D509 Iron deficiency anemia, unspecified: Secondary | ICD-10-CM | POA: Diagnosis not present

## 2012-05-26 DIAGNOSIS — N2581 Secondary hyperparathyroidism of renal origin: Secondary | ICD-10-CM | POA: Diagnosis not present

## 2012-05-29 DIAGNOSIS — N039 Chronic nephritic syndrome with unspecified morphologic changes: Secondary | ICD-10-CM | POA: Diagnosis not present

## 2012-05-29 DIAGNOSIS — N2581 Secondary hyperparathyroidism of renal origin: Secondary | ICD-10-CM | POA: Diagnosis not present

## 2012-05-29 DIAGNOSIS — N186 End stage renal disease: Secondary | ICD-10-CM | POA: Diagnosis not present

## 2012-05-29 DIAGNOSIS — D509 Iron deficiency anemia, unspecified: Secondary | ICD-10-CM | POA: Diagnosis not present

## 2012-05-31 DIAGNOSIS — N039 Chronic nephritic syndrome with unspecified morphologic changes: Secondary | ICD-10-CM | POA: Diagnosis not present

## 2012-05-31 DIAGNOSIS — N2581 Secondary hyperparathyroidism of renal origin: Secondary | ICD-10-CM | POA: Diagnosis not present

## 2012-05-31 DIAGNOSIS — N186 End stage renal disease: Secondary | ICD-10-CM | POA: Diagnosis not present

## 2012-05-31 DIAGNOSIS — D509 Iron deficiency anemia, unspecified: Secondary | ICD-10-CM | POA: Diagnosis not present

## 2012-06-05 DIAGNOSIS — N186 End stage renal disease: Secondary | ICD-10-CM | POA: Diagnosis not present

## 2012-06-05 DIAGNOSIS — D509 Iron deficiency anemia, unspecified: Secondary | ICD-10-CM | POA: Diagnosis not present

## 2012-06-05 DIAGNOSIS — N039 Chronic nephritic syndrome with unspecified morphologic changes: Secondary | ICD-10-CM | POA: Diagnosis not present

## 2012-06-05 DIAGNOSIS — N2581 Secondary hyperparathyroidism of renal origin: Secondary | ICD-10-CM | POA: Diagnosis not present

## 2012-06-07 DIAGNOSIS — N039 Chronic nephritic syndrome with unspecified morphologic changes: Secondary | ICD-10-CM | POA: Diagnosis not present

## 2012-06-07 DIAGNOSIS — N186 End stage renal disease: Secondary | ICD-10-CM | POA: Diagnosis not present

## 2012-06-07 DIAGNOSIS — N2581 Secondary hyperparathyroidism of renal origin: Secondary | ICD-10-CM | POA: Diagnosis not present

## 2012-06-07 DIAGNOSIS — D509 Iron deficiency anemia, unspecified: Secondary | ICD-10-CM | POA: Diagnosis not present

## 2012-06-11 DIAGNOSIS — N186 End stage renal disease: Secondary | ICD-10-CM | POA: Diagnosis not present

## 2012-06-12 DIAGNOSIS — Z23 Encounter for immunization: Secondary | ICD-10-CM | POA: Diagnosis not present

## 2012-06-12 DIAGNOSIS — N186 End stage renal disease: Secondary | ICD-10-CM | POA: Diagnosis not present

## 2012-06-12 DIAGNOSIS — D631 Anemia in chronic kidney disease: Secondary | ICD-10-CM | POA: Diagnosis not present

## 2012-06-12 DIAGNOSIS — D509 Iron deficiency anemia, unspecified: Secondary | ICD-10-CM | POA: Diagnosis not present

## 2012-06-12 DIAGNOSIS — N2581 Secondary hyperparathyroidism of renal origin: Secondary | ICD-10-CM | POA: Diagnosis not present

## 2012-06-19 DIAGNOSIS — N259 Disorder resulting from impaired renal tubular function, unspecified: Secondary | ICD-10-CM | POA: Diagnosis not present

## 2012-06-19 DIAGNOSIS — I10 Essential (primary) hypertension: Secondary | ICD-10-CM | POA: Diagnosis not present

## 2012-06-19 DIAGNOSIS — D649 Anemia, unspecified: Secondary | ICD-10-CM | POA: Diagnosis not present

## 2012-06-19 DIAGNOSIS — N189 Chronic kidney disease, unspecified: Secondary | ICD-10-CM | POA: Diagnosis not present

## 2012-06-19 DIAGNOSIS — I129 Hypertensive chronic kidney disease with stage 1 through stage 4 chronic kidney disease, or unspecified chronic kidney disease: Secondary | ICD-10-CM | POA: Diagnosis not present

## 2012-07-12 DIAGNOSIS — N186 End stage renal disease: Secondary | ICD-10-CM | POA: Diagnosis not present

## 2012-07-14 DIAGNOSIS — D631 Anemia in chronic kidney disease: Secondary | ICD-10-CM | POA: Diagnosis not present

## 2012-07-14 DIAGNOSIS — D509 Iron deficiency anemia, unspecified: Secondary | ICD-10-CM | POA: Diagnosis not present

## 2012-07-14 DIAGNOSIS — N186 End stage renal disease: Secondary | ICD-10-CM | POA: Diagnosis not present

## 2012-08-11 DIAGNOSIS — N186 End stage renal disease: Secondary | ICD-10-CM | POA: Diagnosis not present

## 2012-08-14 DIAGNOSIS — N186 End stage renal disease: Secondary | ICD-10-CM | POA: Diagnosis not present

## 2012-08-14 DIAGNOSIS — D631 Anemia in chronic kidney disease: Secondary | ICD-10-CM | POA: Diagnosis not present

## 2012-08-14 DIAGNOSIS — D509 Iron deficiency anemia, unspecified: Secondary | ICD-10-CM | POA: Diagnosis not present

## 2012-09-11 DIAGNOSIS — N186 End stage renal disease: Secondary | ICD-10-CM | POA: Diagnosis not present

## 2012-09-13 DIAGNOSIS — N186 End stage renal disease: Secondary | ICD-10-CM | POA: Diagnosis not present

## 2012-09-13 DIAGNOSIS — D631 Anemia in chronic kidney disease: Secondary | ICD-10-CM | POA: Diagnosis not present

## 2012-09-13 DIAGNOSIS — D509 Iron deficiency anemia, unspecified: Secondary | ICD-10-CM | POA: Diagnosis not present

## 2012-10-12 DIAGNOSIS — N186 End stage renal disease: Secondary | ICD-10-CM | POA: Diagnosis not present

## 2012-10-13 DIAGNOSIS — N186 End stage renal disease: Secondary | ICD-10-CM | POA: Diagnosis not present

## 2012-11-09 DIAGNOSIS — N186 End stage renal disease: Secondary | ICD-10-CM | POA: Diagnosis not present

## 2012-11-10 DIAGNOSIS — N186 End stage renal disease: Secondary | ICD-10-CM | POA: Diagnosis not present

## 2012-11-10 DIAGNOSIS — D631 Anemia in chronic kidney disease: Secondary | ICD-10-CM | POA: Diagnosis not present

## 2012-11-14 ENCOUNTER — Encounter (HOSPITAL_COMMUNITY): Payer: Self-pay | Admitting: Emergency Medicine

## 2012-11-14 ENCOUNTER — Emergency Department (HOSPITAL_COMMUNITY)
Admission: EM | Admit: 2012-11-14 | Discharge: 2012-11-14 | Disposition: A | Discharge status: Home / Self Care | Payer: Medicare Other | Attending: Emergency Medicine | Admitting: Emergency Medicine

## 2012-11-14 DIAGNOSIS — Z992 Dependence on renal dialysis: Secondary | ICD-10-CM | POA: Diagnosis not present

## 2012-11-14 DIAGNOSIS — Z87448 Personal history of other diseases of urinary system: Secondary | ICD-10-CM | POA: Insufficient documentation

## 2012-11-14 DIAGNOSIS — N342 Other urethritis: Secondary | ICD-10-CM | POA: Diagnosis not present

## 2012-11-14 DIAGNOSIS — R109 Unspecified abdominal pain: Secondary | ICD-10-CM | POA: Insufficient documentation

## 2012-11-14 DIAGNOSIS — I10 Essential (primary) hypertension: Secondary | ICD-10-CM | POA: Insufficient documentation

## 2012-11-14 DIAGNOSIS — F17200 Nicotine dependence, unspecified, uncomplicated: Secondary | ICD-10-CM | POA: Insufficient documentation

## 2012-11-14 DIAGNOSIS — Z79899 Other long term (current) drug therapy: Secondary | ICD-10-CM | POA: Insufficient documentation

## 2012-11-14 MED ORDER — CEFTRIAXONE SODIUM 250 MG IJ SOLR
250.0000 mg | Freq: Once | INTRAMUSCULAR | Status: AC
  Administered 2012-11-14: 250 mg via INTRAMUSCULAR
  Filled 2012-11-14: qty 250

## 2012-11-14 MED ORDER — AZITHROMYCIN 250 MG PO TABS
1000.0000 mg | ORAL_TABLET | Freq: Once | ORAL | Status: AC
  Administered 2012-11-14: 1000 mg via ORAL
  Filled 2012-11-14: qty 4

## 2012-11-14 NOTE — ED Provider Notes (Signed)
History     CSN: [id number]  Arrival date & time 11/14/12  4098   First MD Initiated Contact with Patient 11/14/12 [phone]      Chief Complaint  Patient presents with  . Penile Discharge  . Abdominal Pain    (Consider location/radiation/quality/duration/timing/severity/associated sxs/prior treatment) Patient is a 37 y.o. male presenting with penile discharge. The history is provided by the patient.  Penile Discharge  pt notes in past couple days, mild drainage from end of penis, sl blood tinged/thick. Hx esrd on hd, makes no urine at baseline, as such no dysuria or other gu c/o other than noted above. No abdominal or flank pain. No fever or chills. No nv. Does not feel ill or sick. Had normal hd yesterday.  No known std exposure. No trauma to area. Denies any scrotal or testicular pain.    Past Medical History  Diagnosis Date  . Hypertension   . Kidney failure   . Dialysis patient     History reviewed. No pertinent past surgical history.  History reviewed. No pertinent family history.  History  Substance Use Topics  . Smoking status: Current Every Day Smoker -- 0.50 packs/day  . Smokeless tobacco: Not on file  . Alcohol Use: No      Review of Systems  Constitutional: Negative for fever.  Gastrointestinal: Negative for nausea and vomiting.  Genitourinary: Positive for discharge. Negative for dysuria.  Skin: Negative for rash.    Allergies  Review of patient's allergies indicates no known allergies.  Home Medications   Current Outpatient Rx  Name  Route  Sig  Dispense  Refill  . calcium acetate (PHOSLO) 667 MG capsule   Oral   Take 667 mg by mouth 3 (three) times daily with meals.         Marland Kitchen labetalol (NORMODYNE) 200 MG tablet   Oral   Take 200 mg by mouth 2 (two) times daily.         Marland Kitchen lisinopril (PRINIVIL,ZESTRIL) 20 MG tablet   Oral   Take 20 mg by mouth daily.           BP 156/99  Pulse 83  Temp(Src) 98.1 F (36.7 C) (Oral)  Resp 18  SpO2  98%  Physical Exam  Nursing note and vitals reviewed. Constitutional: He is oriented to person, place, and time. He appears well-developed and well-nourished. No distress.  Eyes: Conjunctivae are normal.  Neck: Neck supple. No tracheal deviation present.  Cardiovascular: Normal rate.   Pulmonary/Chest: Effort normal. No accessory muscle usage. No respiratory distress.  Abdominal: Soft. He exhibits no distension. There is no tenderness.  Genitourinary:  No cva tenderness. Normal external genitalia. Testes desc bil, no scrotal or testicular pain, swelling, or tenderness. Very mild, thick urethral d/c  Musculoskeletal: Normal range of motion. He exhibits no edema.  Neurological: He is alert and oriented to person, place, and time.  Skin: Skin is warm and dry. No rash noted.  Psychiatric: He has a normal mood and affect.    ED Course  Procedures (including critical care time)     MDM  Mild urethral discharge.  Confirmed nkda w pt.  Rocephin im, zithromax po.  Urethral cx sent.          Suzi Roots, MD 11/14/12 [phone]

## 2012-11-14 NOTE — ED Notes (Signed)
Pt arrives to ed c/o blood out of penis, pt is dyalysis pt-tue. Thur, sat- and cannot void. Pt sts onset yesterday and describes as "dark red drops of blood" discharged from penis.  Pt sts no hx same.  Caox4, pmsx4, no obvious injury, nad.  No active bleed at this time.

## 2012-11-15 LAB — GC/CHLAMYDIA PROBE AMP: CT Probe RNA: NEGATIVE

## 2012-12-10 DIAGNOSIS — N186 End stage renal disease: Secondary | ICD-10-CM | POA: Diagnosis not present

## 2012-12-11 DIAGNOSIS — D631 Anemia in chronic kidney disease: Secondary | ICD-10-CM | POA: Diagnosis not present

## 2012-12-11 DIAGNOSIS — N186 End stage renal disease: Secondary | ICD-10-CM | POA: Diagnosis not present

## 2013-01-09 DIAGNOSIS — N186 End stage renal disease: Secondary | ICD-10-CM | POA: Diagnosis not present

## 2013-01-10 DIAGNOSIS — K6289 Other specified diseases of anus and rectum: Secondary | ICD-10-CM | POA: Diagnosis not present

## 2013-01-10 DIAGNOSIS — N039 Chronic nephritic syndrome with unspecified morphologic changes: Secondary | ICD-10-CM | POA: Diagnosis not present

## 2013-01-10 DIAGNOSIS — K644 Residual hemorrhoidal skin tags: Secondary | ICD-10-CM | POA: Diagnosis not present

## 2013-01-10 DIAGNOSIS — R58 Hemorrhage, not elsewhere classified: Secondary | ICD-10-CM | POA: Diagnosis not present

## 2013-01-10 DIAGNOSIS — N186 End stage renal disease: Secondary | ICD-10-CM | POA: Diagnosis not present

## 2013-01-10 DIAGNOSIS — D631 Anemia in chronic kidney disease: Secondary | ICD-10-CM | POA: Diagnosis not present

## 2013-01-15 DIAGNOSIS — N039 Chronic nephritic syndrome with unspecified morphologic changes: Secondary | ICD-10-CM | POA: Diagnosis not present

## 2013-01-15 DIAGNOSIS — N186 End stage renal disease: Secondary | ICD-10-CM | POA: Diagnosis not present

## 2013-01-17 DIAGNOSIS — N039 Chronic nephritic syndrome with unspecified morphologic changes: Secondary | ICD-10-CM | POA: Diagnosis not present

## 2013-01-17 DIAGNOSIS — N186 End stage renal disease: Secondary | ICD-10-CM | POA: Diagnosis not present

## 2013-01-19 DIAGNOSIS — N039 Chronic nephritic syndrome with unspecified morphologic changes: Secondary | ICD-10-CM | POA: Diagnosis not present

## 2013-01-19 DIAGNOSIS — N186 End stage renal disease: Secondary | ICD-10-CM | POA: Diagnosis not present

## 2013-01-21 DIAGNOSIS — Z992 Dependence on renal dialysis: Secondary | ICD-10-CM | POA: Diagnosis not present

## 2013-01-21 DIAGNOSIS — F17200 Nicotine dependence, unspecified, uncomplicated: Secondary | ICD-10-CM | POA: Diagnosis not present

## 2013-01-21 DIAGNOSIS — F411 Generalized anxiety disorder: Secondary | ICD-10-CM | POA: Diagnosis not present

## 2013-01-21 DIAGNOSIS — R221 Localized swelling, mass and lump, neck: Secondary | ICD-10-CM | POA: Diagnosis not present

## 2013-01-21 DIAGNOSIS — I129 Hypertensive chronic kidney disease with stage 1 through stage 4 chronic kidney disease, or unspecified chronic kidney disease: Secondary | ICD-10-CM | POA: Diagnosis not present

## 2013-01-21 DIAGNOSIS — R6884 Jaw pain: Secondary | ICD-10-CM | POA: Diagnosis not present

## 2013-01-21 DIAGNOSIS — R220 Localized swelling, mass and lump, head: Secondary | ICD-10-CM | POA: Diagnosis not present

## 2013-01-21 DIAGNOSIS — K044 Acute apical periodontitis of pulpal origin: Secondary | ICD-10-CM | POA: Diagnosis not present

## 2013-01-21 DIAGNOSIS — R011 Cardiac murmur, unspecified: Secondary | ICD-10-CM | POA: Diagnosis not present

## 2013-01-22 DIAGNOSIS — N039 Chronic nephritic syndrome with unspecified morphologic changes: Secondary | ICD-10-CM | POA: Diagnosis not present

## 2013-01-22 DIAGNOSIS — N186 End stage renal disease: Secondary | ICD-10-CM | POA: Diagnosis not present

## 2013-01-28 DIAGNOSIS — N186 End stage renal disease: Secondary | ICD-10-CM | POA: Diagnosis not present

## 2013-01-28 DIAGNOSIS — D631 Anemia in chronic kidney disease: Secondary | ICD-10-CM | POA: Diagnosis not present

## 2013-01-31 DIAGNOSIS — D631 Anemia in chronic kidney disease: Secondary | ICD-10-CM | POA: Diagnosis not present

## 2013-01-31 DIAGNOSIS — N186 End stage renal disease: Secondary | ICD-10-CM | POA: Diagnosis not present

## 2013-02-04 DIAGNOSIS — N039 Chronic nephritic syndrome with unspecified morphologic changes: Secondary | ICD-10-CM | POA: Diagnosis not present

## 2013-02-04 DIAGNOSIS — N186 End stage renal disease: Secondary | ICD-10-CM | POA: Diagnosis not present

## 2013-02-07 DIAGNOSIS — N039 Chronic nephritic syndrome with unspecified morphologic changes: Secondary | ICD-10-CM | POA: Diagnosis not present

## 2013-02-07 DIAGNOSIS — N186 End stage renal disease: Secondary | ICD-10-CM | POA: Diagnosis not present

## 2013-02-09 DIAGNOSIS — N186 End stage renal disease: Secondary | ICD-10-CM | POA: Diagnosis not present

## 2013-02-11 DIAGNOSIS — D631 Anemia in chronic kidney disease: Secondary | ICD-10-CM | POA: Diagnosis not present

## 2013-02-11 DIAGNOSIS — N186 End stage renal disease: Secondary | ICD-10-CM | POA: Diagnosis not present

## 2013-02-11 DIAGNOSIS — D509 Iron deficiency anemia, unspecified: Secondary | ICD-10-CM | POA: Diagnosis not present

## 2013-03-07 DIAGNOSIS — I10 Essential (primary) hypertension: Secondary | ICD-10-CM | POA: Diagnosis not present

## 2013-03-11 DIAGNOSIS — N186 End stage renal disease: Secondary | ICD-10-CM | POA: Diagnosis not present

## 2013-03-14 DIAGNOSIS — D631 Anemia in chronic kidney disease: Secondary | ICD-10-CM | POA: Diagnosis not present

## 2013-03-14 DIAGNOSIS — N039 Chronic nephritic syndrome with unspecified morphologic changes: Secondary | ICD-10-CM | POA: Diagnosis not present

## 2013-03-14 DIAGNOSIS — D509 Iron deficiency anemia, unspecified: Secondary | ICD-10-CM | POA: Diagnosis not present

## 2013-03-14 DIAGNOSIS — N186 End stage renal disease: Secondary | ICD-10-CM | POA: Diagnosis not present

## 2013-03-18 DIAGNOSIS — N186 End stage renal disease: Secondary | ICD-10-CM | POA: Diagnosis not present

## 2013-03-18 DIAGNOSIS — D631 Anemia in chronic kidney disease: Secondary | ICD-10-CM | POA: Diagnosis not present

## 2013-03-18 DIAGNOSIS — D509 Iron deficiency anemia, unspecified: Secondary | ICD-10-CM | POA: Diagnosis not present

## 2013-03-21 DIAGNOSIS — D509 Iron deficiency anemia, unspecified: Secondary | ICD-10-CM | POA: Diagnosis not present

## 2013-03-21 DIAGNOSIS — N039 Chronic nephritic syndrome with unspecified morphologic changes: Secondary | ICD-10-CM | POA: Diagnosis not present

## 2013-03-21 DIAGNOSIS — N186 End stage renal disease: Secondary | ICD-10-CM | POA: Diagnosis not present

## 2013-03-25 DIAGNOSIS — N039 Chronic nephritic syndrome with unspecified morphologic changes: Secondary | ICD-10-CM | POA: Diagnosis not present

## 2013-03-25 DIAGNOSIS — N186 End stage renal disease: Secondary | ICD-10-CM | POA: Diagnosis not present

## 2013-03-25 DIAGNOSIS — D509 Iron deficiency anemia, unspecified: Secondary | ICD-10-CM | POA: Diagnosis not present

## 2013-03-27 DIAGNOSIS — D631 Anemia in chronic kidney disease: Secondary | ICD-10-CM | POA: Diagnosis not present

## 2013-03-27 DIAGNOSIS — D509 Iron deficiency anemia, unspecified: Secondary | ICD-10-CM | POA: Diagnosis not present

## 2013-03-27 DIAGNOSIS — N186 End stage renal disease: Secondary | ICD-10-CM | POA: Diagnosis not present

## 2013-03-29 DIAGNOSIS — D509 Iron deficiency anemia, unspecified: Secondary | ICD-10-CM | POA: Diagnosis not present

## 2013-03-29 DIAGNOSIS — N186 End stage renal disease: Secondary | ICD-10-CM | POA: Diagnosis not present

## 2013-03-29 DIAGNOSIS — D631 Anemia in chronic kidney disease: Secondary | ICD-10-CM | POA: Diagnosis not present

## 2013-04-01 DIAGNOSIS — D509 Iron deficiency anemia, unspecified: Secondary | ICD-10-CM | POA: Diagnosis not present

## 2013-04-01 DIAGNOSIS — D631 Anemia in chronic kidney disease: Secondary | ICD-10-CM | POA: Diagnosis not present

## 2013-04-01 DIAGNOSIS — N186 End stage renal disease: Secondary | ICD-10-CM | POA: Diagnosis not present

## 2013-04-04 DIAGNOSIS — N039 Chronic nephritic syndrome with unspecified morphologic changes: Secondary | ICD-10-CM | POA: Diagnosis not present

## 2013-04-04 DIAGNOSIS — N186 End stage renal disease: Secondary | ICD-10-CM | POA: Diagnosis not present

## 2013-04-04 DIAGNOSIS — D509 Iron deficiency anemia, unspecified: Secondary | ICD-10-CM | POA: Diagnosis not present

## 2013-04-08 DIAGNOSIS — N039 Chronic nephritic syndrome with unspecified morphologic changes: Secondary | ICD-10-CM | POA: Diagnosis not present

## 2013-04-08 DIAGNOSIS — N186 End stage renal disease: Secondary | ICD-10-CM | POA: Diagnosis not present

## 2013-04-08 DIAGNOSIS — D509 Iron deficiency anemia, unspecified: Secondary | ICD-10-CM | POA: Diagnosis not present

## 2013-04-11 DIAGNOSIS — D509 Iron deficiency anemia, unspecified: Secondary | ICD-10-CM | POA: Diagnosis not present

## 2013-04-11 DIAGNOSIS — N186 End stage renal disease: Secondary | ICD-10-CM | POA: Diagnosis not present

## 2013-04-11 DIAGNOSIS — N039 Chronic nephritic syndrome with unspecified morphologic changes: Secondary | ICD-10-CM | POA: Diagnosis not present

## 2013-04-15 DIAGNOSIS — D509 Iron deficiency anemia, unspecified: Secondary | ICD-10-CM | POA: Diagnosis not present

## 2013-04-15 DIAGNOSIS — N186 End stage renal disease: Secondary | ICD-10-CM | POA: Diagnosis not present

## 2013-04-18 DIAGNOSIS — D509 Iron deficiency anemia, unspecified: Secondary | ICD-10-CM | POA: Diagnosis not present

## 2013-04-18 DIAGNOSIS — N186 End stage renal disease: Secondary | ICD-10-CM | POA: Diagnosis not present

## 2013-04-22 DIAGNOSIS — D509 Iron deficiency anemia, unspecified: Secondary | ICD-10-CM | POA: Diagnosis not present

## 2013-04-22 DIAGNOSIS — N186 End stage renal disease: Secondary | ICD-10-CM | POA: Diagnosis not present

## 2013-04-25 DIAGNOSIS — D509 Iron deficiency anemia, unspecified: Secondary | ICD-10-CM | POA: Diagnosis not present

## 2013-04-25 DIAGNOSIS — N186 End stage renal disease: Secondary | ICD-10-CM | POA: Diagnosis not present

## 2013-04-29 DIAGNOSIS — N186 End stage renal disease: Secondary | ICD-10-CM | POA: Diagnosis not present

## 2013-04-29 DIAGNOSIS — D509 Iron deficiency anemia, unspecified: Secondary | ICD-10-CM | POA: Diagnosis not present

## 2013-05-02 DIAGNOSIS — N186 End stage renal disease: Secondary | ICD-10-CM | POA: Diagnosis not present

## 2013-05-02 DIAGNOSIS — D509 Iron deficiency anemia, unspecified: Secondary | ICD-10-CM | POA: Diagnosis not present

## 2013-05-06 DIAGNOSIS — N186 End stage renal disease: Secondary | ICD-10-CM | POA: Diagnosis not present

## 2013-05-06 DIAGNOSIS — D509 Iron deficiency anemia, unspecified: Secondary | ICD-10-CM | POA: Diagnosis not present

## 2013-05-08 DIAGNOSIS — N186 End stage renal disease: Secondary | ICD-10-CM | POA: Diagnosis not present

## 2013-05-08 DIAGNOSIS — D509 Iron deficiency anemia, unspecified: Secondary | ICD-10-CM | POA: Diagnosis not present

## 2013-05-10 DIAGNOSIS — D509 Iron deficiency anemia, unspecified: Secondary | ICD-10-CM | POA: Diagnosis not present

## 2013-05-10 DIAGNOSIS — N186 End stage renal disease: Secondary | ICD-10-CM | POA: Diagnosis not present

## 2013-05-12 DIAGNOSIS — N186 End stage renal disease: Secondary | ICD-10-CM | POA: Diagnosis not present

## 2013-05-13 DIAGNOSIS — N186 End stage renal disease: Secondary | ICD-10-CM | POA: Diagnosis not present

## 2013-05-13 DIAGNOSIS — D631 Anemia in chronic kidney disease: Secondary | ICD-10-CM | POA: Diagnosis not present

## 2013-06-03 DIAGNOSIS — I10 Essential (primary) hypertension: Secondary | ICD-10-CM | POA: Diagnosis not present

## 2013-06-03 DIAGNOSIS — R6889 Other general symptoms and signs: Secondary | ICD-10-CM | POA: Diagnosis not present

## 2013-06-11 DIAGNOSIS — N186 End stage renal disease: Secondary | ICD-10-CM | POA: Diagnosis not present

## 2013-06-14 DIAGNOSIS — N186 End stage renal disease: Secondary | ICD-10-CM | POA: Diagnosis not present

## 2013-06-14 DIAGNOSIS — D631 Anemia in chronic kidney disease: Secondary | ICD-10-CM | POA: Diagnosis not present

## 2013-06-14 DIAGNOSIS — Z23 Encounter for immunization: Secondary | ICD-10-CM | POA: Diagnosis not present

## 2013-06-17 DIAGNOSIS — D631 Anemia in chronic kidney disease: Secondary | ICD-10-CM | POA: Diagnosis not present

## 2013-06-17 DIAGNOSIS — Z23 Encounter for immunization: Secondary | ICD-10-CM | POA: Diagnosis not present

## 2013-06-17 DIAGNOSIS — N186 End stage renal disease: Secondary | ICD-10-CM | POA: Diagnosis not present

## 2013-06-20 DIAGNOSIS — D631 Anemia in chronic kidney disease: Secondary | ICD-10-CM | POA: Diagnosis not present

## 2013-06-20 DIAGNOSIS — Z23 Encounter for immunization: Secondary | ICD-10-CM | POA: Diagnosis not present

## 2013-06-20 DIAGNOSIS — N186 End stage renal disease: Secondary | ICD-10-CM | POA: Diagnosis not present

## 2013-06-21 DIAGNOSIS — N186 End stage renal disease: Secondary | ICD-10-CM | POA: Diagnosis not present

## 2013-06-21 DIAGNOSIS — D631 Anemia in chronic kidney disease: Secondary | ICD-10-CM | POA: Diagnosis not present

## 2013-06-21 DIAGNOSIS — Z23 Encounter for immunization: Secondary | ICD-10-CM | POA: Diagnosis not present

## 2013-06-24 DIAGNOSIS — Z23 Encounter for immunization: Secondary | ICD-10-CM | POA: Diagnosis not present

## 2013-06-24 DIAGNOSIS — D631 Anemia in chronic kidney disease: Secondary | ICD-10-CM | POA: Diagnosis not present

## 2013-06-24 DIAGNOSIS — N186 End stage renal disease: Secondary | ICD-10-CM | POA: Diagnosis not present

## 2013-06-28 DIAGNOSIS — D631 Anemia in chronic kidney disease: Secondary | ICD-10-CM | POA: Diagnosis not present

## 2013-06-28 DIAGNOSIS — N186 End stage renal disease: Secondary | ICD-10-CM | POA: Diagnosis not present

## 2013-06-28 DIAGNOSIS — Z23 Encounter for immunization: Secondary | ICD-10-CM | POA: Diagnosis not present

## 2013-07-01 DIAGNOSIS — Z23 Encounter for immunization: Secondary | ICD-10-CM | POA: Diagnosis not present

## 2013-07-01 DIAGNOSIS — N186 End stage renal disease: Secondary | ICD-10-CM | POA: Diagnosis not present

## 2013-07-01 DIAGNOSIS — D631 Anemia in chronic kidney disease: Secondary | ICD-10-CM | POA: Diagnosis not present

## 2013-07-05 DIAGNOSIS — Z23 Encounter for immunization: Secondary | ICD-10-CM | POA: Diagnosis not present

## 2013-07-05 DIAGNOSIS — N186 End stage renal disease: Secondary | ICD-10-CM | POA: Diagnosis not present

## 2013-07-05 DIAGNOSIS — D631 Anemia in chronic kidney disease: Secondary | ICD-10-CM | POA: Diagnosis not present

## 2013-07-08 DIAGNOSIS — Z23 Encounter for immunization: Secondary | ICD-10-CM | POA: Diagnosis not present

## 2013-07-08 DIAGNOSIS — D631 Anemia in chronic kidney disease: Secondary | ICD-10-CM | POA: Diagnosis not present

## 2013-07-08 DIAGNOSIS — N186 End stage renal disease: Secondary | ICD-10-CM | POA: Diagnosis not present

## 2013-07-10 DIAGNOSIS — Z23 Encounter for immunization: Secondary | ICD-10-CM | POA: Diagnosis not present

## 2013-07-10 DIAGNOSIS — D631 Anemia in chronic kidney disease: Secondary | ICD-10-CM | POA: Diagnosis not present

## 2013-07-10 DIAGNOSIS — N186 End stage renal disease: Secondary | ICD-10-CM | POA: Diagnosis not present

## 2013-07-12 DIAGNOSIS — N186 End stage renal disease: Secondary | ICD-10-CM | POA: Diagnosis not present

## 2013-07-13 DIAGNOSIS — N186 End stage renal disease: Secondary | ICD-10-CM | POA: Diagnosis not present

## 2013-07-13 DIAGNOSIS — D631 Anemia in chronic kidney disease: Secondary | ICD-10-CM | POA: Diagnosis not present

## 2013-07-16 DIAGNOSIS — D631 Anemia in chronic kidney disease: Secondary | ICD-10-CM | POA: Diagnosis not present

## 2013-07-16 DIAGNOSIS — N186 End stage renal disease: Secondary | ICD-10-CM | POA: Diagnosis not present

## 2013-07-19 DIAGNOSIS — N186 End stage renal disease: Secondary | ICD-10-CM | POA: Diagnosis not present

## 2013-07-19 DIAGNOSIS — D631 Anemia in chronic kidney disease: Secondary | ICD-10-CM | POA: Diagnosis not present

## 2013-07-22 DIAGNOSIS — N186 End stage renal disease: Secondary | ICD-10-CM | POA: Diagnosis not present

## 2013-07-22 DIAGNOSIS — D631 Anemia in chronic kidney disease: Secondary | ICD-10-CM | POA: Diagnosis not present

## 2013-07-24 DIAGNOSIS — D631 Anemia in chronic kidney disease: Secondary | ICD-10-CM | POA: Diagnosis not present

## 2013-07-24 DIAGNOSIS — N186 End stage renal disease: Secondary | ICD-10-CM | POA: Diagnosis not present

## 2013-07-26 DIAGNOSIS — D631 Anemia in chronic kidney disease: Secondary | ICD-10-CM | POA: Diagnosis not present

## 2013-07-26 DIAGNOSIS — N186 End stage renal disease: Secondary | ICD-10-CM | POA: Diagnosis not present

## 2013-07-29 DIAGNOSIS — N186 End stage renal disease: Secondary | ICD-10-CM | POA: Diagnosis not present

## 2013-07-29 DIAGNOSIS — D631 Anemia in chronic kidney disease: Secondary | ICD-10-CM | POA: Diagnosis not present

## 2013-08-02 DIAGNOSIS — D631 Anemia in chronic kidney disease: Secondary | ICD-10-CM | POA: Diagnosis not present

## 2013-08-02 DIAGNOSIS — N186 End stage renal disease: Secondary | ICD-10-CM | POA: Diagnosis not present

## 2013-08-05 DIAGNOSIS — N186 End stage renal disease: Secondary | ICD-10-CM | POA: Diagnosis not present

## 2013-08-05 DIAGNOSIS — D631 Anemia in chronic kidney disease: Secondary | ICD-10-CM | POA: Diagnosis not present

## 2013-08-07 DIAGNOSIS — N186 End stage renal disease: Secondary | ICD-10-CM | POA: Diagnosis not present

## 2013-08-07 DIAGNOSIS — D631 Anemia in chronic kidney disease: Secondary | ICD-10-CM | POA: Diagnosis not present

## 2013-08-09 DIAGNOSIS — N186 End stage renal disease: Secondary | ICD-10-CM | POA: Diagnosis not present

## 2013-08-09 DIAGNOSIS — D631 Anemia in chronic kidney disease: Secondary | ICD-10-CM | POA: Diagnosis not present

## 2013-08-11 DIAGNOSIS — N186 End stage renal disease: Secondary | ICD-10-CM | POA: Diagnosis not present

## 2013-08-12 DIAGNOSIS — N186 End stage renal disease: Secondary | ICD-10-CM | POA: Diagnosis not present

## 2013-08-12 DIAGNOSIS — D509 Iron deficiency anemia, unspecified: Secondary | ICD-10-CM | POA: Diagnosis not present

## 2013-08-12 DIAGNOSIS — D631 Anemia in chronic kidney disease: Secondary | ICD-10-CM | POA: Diagnosis not present

## 2013-09-11 DIAGNOSIS — N186 End stage renal disease: Secondary | ICD-10-CM | POA: Diagnosis not present

## 2013-09-13 DIAGNOSIS — D631 Anemia in chronic kidney disease: Secondary | ICD-10-CM | POA: Diagnosis not present

## 2013-09-13 DIAGNOSIS — N186 End stage renal disease: Secondary | ICD-10-CM | POA: Diagnosis not present

## 2013-09-23 DIAGNOSIS — N189 Chronic kidney disease, unspecified: Secondary | ICD-10-CM | POA: Diagnosis not present

## 2013-09-23 DIAGNOSIS — J09X2 Influenza due to identified novel influenza A virus with other respiratory manifestations: Secondary | ICD-10-CM | POA: Diagnosis not present

## 2013-09-23 DIAGNOSIS — F17200 Nicotine dependence, unspecified, uncomplicated: Secondary | ICD-10-CM | POA: Diagnosis not present

## 2013-09-23 DIAGNOSIS — T8610 Unspecified complication of kidney transplant: Secondary | ICD-10-CM | POA: Diagnosis not present

## 2013-09-23 DIAGNOSIS — F411 Generalized anxiety disorder: Secondary | ICD-10-CM | POA: Diagnosis not present

## 2013-09-23 DIAGNOSIS — I129 Hypertensive chronic kidney disease with stage 1 through stage 4 chronic kidney disease, or unspecified chronic kidney disease: Secondary | ICD-10-CM | POA: Diagnosis not present

## 2013-10-12 DIAGNOSIS — N186 End stage renal disease: Secondary | ICD-10-CM | POA: Diagnosis not present

## 2013-10-14 DIAGNOSIS — N186 End stage renal disease: Secondary | ICD-10-CM | POA: Diagnosis not present

## 2013-10-14 DIAGNOSIS — D509 Iron deficiency anemia, unspecified: Secondary | ICD-10-CM | POA: Diagnosis not present

## 2013-10-14 DIAGNOSIS — D631 Anemia in chronic kidney disease: Secondary | ICD-10-CM | POA: Diagnosis not present

## 2013-11-09 DIAGNOSIS — N186 End stage renal disease: Secondary | ICD-10-CM | POA: Diagnosis not present

## 2013-11-11 DIAGNOSIS — N186 End stage renal disease: Secondary | ICD-10-CM | POA: Diagnosis not present

## 2013-11-11 DIAGNOSIS — D509 Iron deficiency anemia, unspecified: Secondary | ICD-10-CM | POA: Diagnosis not present

## 2013-11-11 DIAGNOSIS — D631 Anemia in chronic kidney disease: Secondary | ICD-10-CM | POA: Diagnosis not present

## 2013-12-10 DIAGNOSIS — N186 End stage renal disease: Secondary | ICD-10-CM | POA: Diagnosis not present

## 2013-12-11 DIAGNOSIS — D509 Iron deficiency anemia, unspecified: Secondary | ICD-10-CM | POA: Diagnosis not present

## 2013-12-11 DIAGNOSIS — D631 Anemia in chronic kidney disease: Secondary | ICD-10-CM | POA: Diagnosis not present

## 2013-12-11 DIAGNOSIS — N186 End stage renal disease: Secondary | ICD-10-CM | POA: Diagnosis not present

## 2013-12-16 DIAGNOSIS — D631 Anemia in chronic kidney disease: Secondary | ICD-10-CM | POA: Diagnosis not present

## 2013-12-16 DIAGNOSIS — D509 Iron deficiency anemia, unspecified: Secondary | ICD-10-CM | POA: Diagnosis not present

## 2013-12-16 DIAGNOSIS — N186 End stage renal disease: Secondary | ICD-10-CM | POA: Diagnosis not present

## 2013-12-16 DIAGNOSIS — N039 Chronic nephritic syndrome with unspecified morphologic changes: Secondary | ICD-10-CM | POA: Diagnosis not present

## 2013-12-18 DIAGNOSIS — D631 Anemia in chronic kidney disease: Secondary | ICD-10-CM | POA: Diagnosis not present

## 2013-12-18 DIAGNOSIS — D509 Iron deficiency anemia, unspecified: Secondary | ICD-10-CM | POA: Diagnosis not present

## 2013-12-18 DIAGNOSIS — N186 End stage renal disease: Secondary | ICD-10-CM | POA: Diagnosis not present

## 2014-01-09 DIAGNOSIS — N186 End stage renal disease: Secondary | ICD-10-CM | POA: Diagnosis not present

## 2014-01-10 DIAGNOSIS — N186 End stage renal disease: Secondary | ICD-10-CM | POA: Diagnosis not present

## 2014-01-10 DIAGNOSIS — D631 Anemia in chronic kidney disease: Secondary | ICD-10-CM | POA: Diagnosis not present

## 2014-02-09 DIAGNOSIS — N186 End stage renal disease: Secondary | ICD-10-CM | POA: Diagnosis not present

## 2014-02-10 DIAGNOSIS — N186 End stage renal disease: Secondary | ICD-10-CM | POA: Diagnosis not present

## 2014-02-10 DIAGNOSIS — D631 Anemia in chronic kidney disease: Secondary | ICD-10-CM | POA: Diagnosis not present

## 2014-02-10 DIAGNOSIS — N039 Chronic nephritic syndrome with unspecified morphologic changes: Secondary | ICD-10-CM | POA: Diagnosis not present

## 2014-03-11 DIAGNOSIS — N186 End stage renal disease: Secondary | ICD-10-CM | POA: Diagnosis not present

## 2014-03-12 DIAGNOSIS — D631 Anemia in chronic kidney disease: Secondary | ICD-10-CM | POA: Diagnosis not present

## 2014-03-12 DIAGNOSIS — N186 End stage renal disease: Secondary | ICD-10-CM | POA: Diagnosis not present

## 2014-03-14 DIAGNOSIS — N186 End stage renal disease: Secondary | ICD-10-CM | POA: Diagnosis not present

## 2014-03-14 DIAGNOSIS — D631 Anemia in chronic kidney disease: Secondary | ICD-10-CM | POA: Diagnosis not present

## 2014-03-17 DIAGNOSIS — D631 Anemia in chronic kidney disease: Secondary | ICD-10-CM | POA: Diagnosis not present

## 2014-03-17 DIAGNOSIS — N186 End stage renal disease: Secondary | ICD-10-CM | POA: Diagnosis not present

## 2014-03-19 DIAGNOSIS — N186 End stage renal disease: Secondary | ICD-10-CM | POA: Diagnosis not present

## 2014-03-19 DIAGNOSIS — D631 Anemia in chronic kidney disease: Secondary | ICD-10-CM | POA: Diagnosis not present

## 2014-03-21 DIAGNOSIS — N039 Chronic nephritic syndrome with unspecified morphologic changes: Secondary | ICD-10-CM | POA: Diagnosis not present

## 2014-03-21 DIAGNOSIS — D631 Anemia in chronic kidney disease: Secondary | ICD-10-CM | POA: Diagnosis not present

## 2014-03-21 DIAGNOSIS — N186 End stage renal disease: Secondary | ICD-10-CM | POA: Diagnosis not present

## 2014-03-24 DIAGNOSIS — N186 End stage renal disease: Secondary | ICD-10-CM | POA: Diagnosis not present

## 2014-03-24 DIAGNOSIS — D631 Anemia in chronic kidney disease: Secondary | ICD-10-CM | POA: Diagnosis not present

## 2014-03-28 DIAGNOSIS — D631 Anemia in chronic kidney disease: Secondary | ICD-10-CM | POA: Diagnosis not present

## 2014-03-28 DIAGNOSIS — N186 End stage renal disease: Secondary | ICD-10-CM | POA: Diagnosis not present

## 2014-04-01 DIAGNOSIS — N039 Chronic nephritic syndrome with unspecified morphologic changes: Secondary | ICD-10-CM | POA: Diagnosis not present

## 2014-04-01 DIAGNOSIS — N186 End stage renal disease: Secondary | ICD-10-CM | POA: Diagnosis not present

## 2014-04-01 DIAGNOSIS — D631 Anemia in chronic kidney disease: Secondary | ICD-10-CM | POA: Diagnosis not present

## 2014-04-04 DIAGNOSIS — N186 End stage renal disease: Secondary | ICD-10-CM | POA: Diagnosis not present

## 2014-04-04 DIAGNOSIS — D631 Anemia in chronic kidney disease: Secondary | ICD-10-CM | POA: Diagnosis not present

## 2014-04-07 DIAGNOSIS — N186 End stage renal disease: Secondary | ICD-10-CM | POA: Diagnosis not present

## 2014-04-07 DIAGNOSIS — D631 Anemia in chronic kidney disease: Secondary | ICD-10-CM | POA: Diagnosis not present

## 2014-04-09 DIAGNOSIS — D631 Anemia in chronic kidney disease: Secondary | ICD-10-CM | POA: Diagnosis not present

## 2014-04-09 DIAGNOSIS — N186 End stage renal disease: Secondary | ICD-10-CM | POA: Diagnosis not present

## 2014-04-11 DIAGNOSIS — N039 Chronic nephritic syndrome with unspecified morphologic changes: Secondary | ICD-10-CM | POA: Diagnosis not present

## 2014-04-11 DIAGNOSIS — D631 Anemia in chronic kidney disease: Secondary | ICD-10-CM | POA: Diagnosis not present

## 2014-04-11 DIAGNOSIS — N186 End stage renal disease: Secondary | ICD-10-CM | POA: Diagnosis not present

## 2014-04-14 DIAGNOSIS — N186 End stage renal disease: Secondary | ICD-10-CM | POA: Diagnosis not present

## 2014-04-14 DIAGNOSIS — D631 Anemia in chronic kidney disease: Secondary | ICD-10-CM | POA: Diagnosis not present

## 2014-04-16 DIAGNOSIS — D631 Anemia in chronic kidney disease: Secondary | ICD-10-CM | POA: Diagnosis not present

## 2014-04-16 DIAGNOSIS — N186 End stage renal disease: Secondary | ICD-10-CM | POA: Diagnosis not present

## 2014-04-18 DIAGNOSIS — D631 Anemia in chronic kidney disease: Secondary | ICD-10-CM | POA: Diagnosis not present

## 2014-04-18 DIAGNOSIS — N186 End stage renal disease: Secondary | ICD-10-CM | POA: Diagnosis not present

## 2014-04-21 DIAGNOSIS — D631 Anemia in chronic kidney disease: Secondary | ICD-10-CM | POA: Diagnosis not present

## 2014-04-21 DIAGNOSIS — N186 End stage renal disease: Secondary | ICD-10-CM | POA: Diagnosis not present

## 2014-04-23 DIAGNOSIS — N186 End stage renal disease: Secondary | ICD-10-CM | POA: Diagnosis not present

## 2014-04-23 DIAGNOSIS — D631 Anemia in chronic kidney disease: Secondary | ICD-10-CM | POA: Diagnosis not present

## 2014-04-25 DIAGNOSIS — D631 Anemia in chronic kidney disease: Secondary | ICD-10-CM | POA: Diagnosis not present

## 2014-04-25 DIAGNOSIS — N186 End stage renal disease: Secondary | ICD-10-CM | POA: Diagnosis not present

## 2014-04-28 DIAGNOSIS — D631 Anemia in chronic kidney disease: Secondary | ICD-10-CM | POA: Diagnosis not present

## 2014-04-28 DIAGNOSIS — N186 End stage renal disease: Secondary | ICD-10-CM | POA: Diagnosis not present

## 2014-04-30 DIAGNOSIS — N186 End stage renal disease: Secondary | ICD-10-CM | POA: Diagnosis not present

## 2014-04-30 DIAGNOSIS — D631 Anemia in chronic kidney disease: Secondary | ICD-10-CM | POA: Diagnosis not present

## 2014-05-03 DIAGNOSIS — D631 Anemia in chronic kidney disease: Secondary | ICD-10-CM | POA: Diagnosis not present

## 2014-05-03 DIAGNOSIS — N186 End stage renal disease: Secondary | ICD-10-CM | POA: Diagnosis not present

## 2014-05-05 DIAGNOSIS — N039 Chronic nephritic syndrome with unspecified morphologic changes: Secondary | ICD-10-CM | POA: Diagnosis not present

## 2014-05-05 DIAGNOSIS — N186 End stage renal disease: Secondary | ICD-10-CM | POA: Diagnosis not present

## 2014-05-05 DIAGNOSIS — D631 Anemia in chronic kidney disease: Secondary | ICD-10-CM | POA: Diagnosis not present

## 2014-05-09 DIAGNOSIS — D631 Anemia in chronic kidney disease: Secondary | ICD-10-CM | POA: Diagnosis not present

## 2014-05-09 DIAGNOSIS — N186 End stage renal disease: Secondary | ICD-10-CM | POA: Diagnosis not present

## 2014-05-12 DIAGNOSIS — N186 End stage renal disease: Secondary | ICD-10-CM | POA: Diagnosis not present

## 2014-05-12 DIAGNOSIS — D631 Anemia in chronic kidney disease: Secondary | ICD-10-CM | POA: Diagnosis not present

## 2014-05-14 DIAGNOSIS — N186 End stage renal disease: Secondary | ICD-10-CM | POA: Diagnosis not present

## 2014-05-14 DIAGNOSIS — D631 Anemia in chronic kidney disease: Secondary | ICD-10-CM | POA: Diagnosis not present

## 2014-05-16 DIAGNOSIS — N039 Chronic nephritic syndrome with unspecified morphologic changes: Secondary | ICD-10-CM | POA: Diagnosis not present

## 2014-05-16 DIAGNOSIS — N186 End stage renal disease: Secondary | ICD-10-CM | POA: Diagnosis not present

## 2014-05-16 DIAGNOSIS — D631 Anemia in chronic kidney disease: Secondary | ICD-10-CM | POA: Diagnosis not present

## 2014-05-19 DIAGNOSIS — N186 End stage renal disease: Secondary | ICD-10-CM | POA: Diagnosis not present

## 2014-05-19 DIAGNOSIS — D631 Anemia in chronic kidney disease: Secondary | ICD-10-CM | POA: Diagnosis not present

## 2014-05-19 DIAGNOSIS — N039 Chronic nephritic syndrome with unspecified morphologic changes: Secondary | ICD-10-CM | POA: Diagnosis not present

## 2014-05-20 DIAGNOSIS — J0100 Acute maxillary sinusitis, unspecified: Secondary | ICD-10-CM | POA: Diagnosis not present

## 2014-05-21 DIAGNOSIS — N039 Chronic nephritic syndrome with unspecified morphologic changes: Secondary | ICD-10-CM | POA: Diagnosis not present

## 2014-05-21 DIAGNOSIS — D631 Anemia in chronic kidney disease: Secondary | ICD-10-CM | POA: Diagnosis not present

## 2014-05-21 DIAGNOSIS — N186 End stage renal disease: Secondary | ICD-10-CM | POA: Diagnosis not present

## 2014-05-23 DIAGNOSIS — D631 Anemia in chronic kidney disease: Secondary | ICD-10-CM | POA: Diagnosis not present

## 2014-05-23 DIAGNOSIS — N186 End stage renal disease: Secondary | ICD-10-CM | POA: Diagnosis not present

## 2014-05-23 DIAGNOSIS — N039 Chronic nephritic syndrome with unspecified morphologic changes: Secondary | ICD-10-CM | POA: Diagnosis not present

## 2014-05-26 DIAGNOSIS — D631 Anemia in chronic kidney disease: Secondary | ICD-10-CM | POA: Diagnosis not present

## 2014-05-26 DIAGNOSIS — N039 Chronic nephritic syndrome with unspecified morphologic changes: Secondary | ICD-10-CM | POA: Diagnosis not present

## 2014-05-26 DIAGNOSIS — N186 End stage renal disease: Secondary | ICD-10-CM | POA: Diagnosis not present

## 2014-05-28 DIAGNOSIS — D631 Anemia in chronic kidney disease: Secondary | ICD-10-CM | POA: Diagnosis not present

## 2014-05-28 DIAGNOSIS — N186 End stage renal disease: Secondary | ICD-10-CM | POA: Diagnosis not present

## 2014-05-31 ENCOUNTER — Encounter (HOSPITAL_COMMUNITY): Payer: Self-pay | Admitting: Emergency Medicine

## 2014-05-31 ENCOUNTER — Emergency Department (HOSPITAL_COMMUNITY)
Admission: EM | Admit: 2014-05-31 | Discharge: 2014-06-01 | Disposition: A | Discharge status: Home / Self Care | Payer: Medicare Other | Attending: Emergency Medicine | Admitting: Emergency Medicine

## 2014-05-31 DIAGNOSIS — N186 End stage renal disease: Secondary | ICD-10-CM | POA: Diagnosis not present

## 2014-05-31 DIAGNOSIS — Z79899 Other long term (current) drug therapy: Secondary | ICD-10-CM | POA: Insufficient documentation

## 2014-05-31 DIAGNOSIS — D631 Anemia in chronic kidney disease: Secondary | ICD-10-CM | POA: Diagnosis not present

## 2014-05-31 DIAGNOSIS — I120 Hypertensive chronic kidney disease with stage 5 chronic kidney disease or end stage renal disease: Secondary | ICD-10-CM | POA: Insufficient documentation

## 2014-05-31 DIAGNOSIS — F17200 Nicotine dependence, unspecified, uncomplicated: Secondary | ICD-10-CM | POA: Diagnosis not present

## 2014-05-31 DIAGNOSIS — M7989 Other specified soft tissue disorders: Secondary | ICD-10-CM | POA: Diagnosis not present

## 2014-05-31 DIAGNOSIS — Y841 Kidney dialysis as the cause of abnormal reaction of the patient, or of later complication, without mention of misadventure at the time of the procedure: Secondary | ICD-10-CM | POA: Insufficient documentation

## 2014-05-31 DIAGNOSIS — I10 Essential (primary) hypertension: Secondary | ICD-10-CM | POA: Diagnosis not present

## 2014-05-31 DIAGNOSIS — T82898A Other specified complication of vascular prosthetic devices, implants and grafts, initial encounter: Secondary | ICD-10-CM | POA: Insufficient documentation

## 2014-05-31 DIAGNOSIS — T829XXA Unspecified complication of cardiac and vascular prosthetic device, implant and graft, initial encounter: Secondary | ICD-10-CM

## 2014-05-31 NOTE — Discharge Instructions (Signed)
1. Medications: usual home medications 2. Treatment: rest, drink plenty of fluids, use mild compression with ACE wrap 3. Follow Up: Please followup with vascular surgery for discussion of your diagnoses and further evaluation after today's visit;

## 2014-05-31 NOTE — ED Notes (Signed)
Pt placed into gown and on monitor upon arrival to room. Pt monitored by blood pressure, pulse ox, and 5 lead. pts family remains at bedside.  

## 2014-05-31 NOTE — ED Notes (Signed)
Pt presents with swelling below dialysis fistula since waking up today- pt had full treatment of dialysis today.  Swelling has gotten bigger since treatment, pt unable to extend arm completely.  Bruit and thrill present, radial pulse strong.

## 2014-05-31 NOTE — ED Notes (Signed)
Pt remains monitored by blood pressure, pulse ox, and 5 lead.  

## 2014-05-31 NOTE — Consult Note (Signed)
Patient name: Curtis Rangel MRN: [id number] DOB: November 22, 1975 Sex: male   Referred by: EDP  Reason for referral:  Chief Complaint  Patient presents with  . Arm Swelling    HISTORY OF PRESENT ILLNESS: The patient is a very pleasant gentleman with a long history of end-stage renal disease. He currently undergoes dialysis at [facility]. He had dialysis without incident 3 days ago. When he awoke this morning he noticed swelling at his left upper arm AV fistula. He did get to dialysis and had successful.also supposed morning. He was concerned regarding the swelling and presented to the emergency room tonight. He has some discomfort with this but this is tolerable. He had a prior history of a forearm fistula and a successful transplant but uses for sure what years. The forearm fistula thrombosed after some trivial trauma in 2010. He then went on to renal failure and had placement of a new upper arm graft approximately 2011. He has had no difficulty since that time.  Past Medical History  Diagnosis Date  . Hypertension   . Kidney failure   . Dialysis patient     History reviewed. No pertinent past surgical history.  History   Social History  . Marital Status: Single    Spouse Name: N/A    Number of Children: N/A  . Years of Education: N/A   Occupational History  . Not on file.   Social History Main Topics  . Smoking status: Current Every Day Smoker -- 0.50 packs/day  . Smokeless tobacco: Not on file  . Alcohol Use: No  . Drug Use: No  . Sexual Activity:    Other Topics Concern  . Not on file   Social History Narrative  . No narrative on file    No family history on file.  Allergies as of 05/31/2014  . (No Known Allergies)    No current facility-administered medications on file prior to encounter.   Current Outpatient Prescriptions on File Prior to Encounter  Medication Sig Dispense Refill  . calcium acetate (PHOSLO) 667 MG capsule Take  667-2,001 mg by mouth 4 (four) times daily. Takes 3 capsules with every meal and takes 1 capsule with each snack.      Marland Kitchen labetalol (NORMODYNE) 200 MG tablet Take 200 mg by mouth 2 (two) times daily.      Marland Kitchen lisinopril (PRINIVIL,ZESTRIL) 20 MG tablet Take 20 mg by mouth daily.         PHYSICAL EXAMINATION:  General: The patient is a well-nourished male, in no acute distress. Vital signs are BP 152/101  Pulse 77  Temp(Src) 97.6 F (36.4 C) (Oral)  Resp 16  Ht 6\' 2"  (1.88 m)  Wt 170 lb (77.111 kg)  BMI 21.82 kg/m2  SpO2 95% Pulmonary: There is a good air exchange   Musculoskeletal: There are no major deformities.  There is no significant extremity pain. Neurologic: No focal weakness or paresthesias are detected, Skin: There are no ulcer or rashes noted. Psychiatric: The patient has normal affect. Cardiovascular: Palpable left radial pulse. The saphenous thrombosed all the forearm fistula. He does have a well-developed excellent per arm cephalic vein brachial cephalic fistula There is swelling posterior to the fistula above the antecubital space. There is mild tenderness to this. There is no expansile nature to this. There is no current bruising.   Impression and Plan:  I discussed this with the patient and his family present. No evidence of ongoing extravasation of blood. He  does appear to have had an infiltration. The timing of this is somewhat unusual. He had successful dialysis 3 days ago and noted swelling on awakening this morning. Fortunately he was able to dialyze successfully. This is posterior to the fistula. I suspect that he in all likelihood had a back wall of the fistula puncture with some bleeding behind this. There is no suggestion of false aneurysm of physical exam. Discussed this with Dr.Wofford.  Abdomen is safe for him to be discharged. I would place an Ace wrap over this for some security but not snug enough to occlude his fistula. He will follow up with dialysis in 2  days. He will notify our office should he have any progressive difficulty    EARLY, TODD [facility] Office: [phone]

## 2014-05-31 NOTE — ED Provider Notes (Signed)
CSN: [id number]     Arrival date & time 05/31/14  2038 History   First MD Initiated Contact with Patient 05/31/14 2049     Chief Complaint  Patient presents with  . Arm Swelling     (Consider location/radiation/quality/duration/timing/severity/associated sxs/prior Treatment) The history is provided by the patient and medical records. No language interpreter was used.    Curtis Rangel is a 38 y.o. male  with a hx of ESRD on dialysis (M, W, F), HTN presents to the Emergency Department complaining of gradual, persistent, progressively worsening swelling to the distal end of the fistula in his left upper arm onset this morning upon waking. Associated symptoms include pain at the site.  Nothing makes symptoms better or worse.  Pt denies fever, chills, neck pain, nausea, vomiting.  Patient reports he missed his dialysis yesterday and went today. He reports that they access the shunt more proximally with a smaller needle without complication and he was able to complete his dialysis however as the evening progressed the swelling became worse as did his pain. He is unable to completely extend his left arm at the elbow due to pain.  Vascular surgeon: Curtis Rangel  Past Medical History  Diagnosis Date  . Hypertension   . Kidney failure   . Dialysis patient    History reviewed. No pertinent past surgical history. No family history on file. History  Substance Use Topics  . Smoking status: Current Every Day Smoker -- 0.50 packs/day  . Smokeless tobacco: Not on file  . Alcohol Use: No    Review of Systems  Constitutional: Negative for fever, diaphoresis, appetite change, fatigue and unexpected weight change.  HENT: Negative for mouth sores.   Eyes: Negative for visual disturbance.  Respiratory: Negative for cough, chest tightness, shortness of breath and wheezing.   Cardiovascular: Negative for chest pain.  Gastrointestinal: Negative for nausea, vomiting, abdominal pain, diarrhea and  constipation.  Endocrine: Negative for polydipsia, polyphagia and polyuria.  Genitourinary: Negative for dysuria, urgency, frequency and hematuria.  Musculoskeletal: Negative for back pain and neck stiffness.  Skin: Negative for rash.       Swelling of the left upper arm at fistula site  Allergic/Immunologic: Negative for immunocompromised state.  Neurological: Negative for syncope, light-headedness and headaches.  Hematological: Does not bruise/bleed easily.  Psychiatric/Behavioral: Negative for sleep disturbance. The patient is not nervous/anxious.       Allergies  Review of patient's allergies indicates no known allergies.  Home Medications   Prior to Admission medications   Medication Sig Start Date End Date Taking? Authorizing Provider  amLODipine (NORVASC) 10 MG tablet Take 10 mg by mouth daily.   Yes Historical Provider, MD  calcium acetate (PHOSLO) 667 MG capsule Take 667-2,001 mg by mouth 4 (four) times daily. Takes 3 capsules with every meal and takes 1 capsule with each snack.   Yes Historical Provider, MD  clonazePAM (KLONOPIN) 1 MG tablet Take 1 mg by mouth daily as needed for anxiety.   Yes Historical Provider, MD  fosinopril (MONOPRIL) 40 MG tablet Take 40 mg by mouth at bedtime.   Yes Historical Provider, MD  ibuprofen (ADVIL,MOTRIN) 200 MG tablet Take 400 mg by mouth daily as needed for headache.   Yes Historical Provider, MD  labetalol (NORMODYNE) 200 MG tablet Take 200 mg by mouth 2 (two) times daily.   Yes Historical Provider, MD  lisinopril (PRINIVIL,ZESTRIL) 20 MG tablet Take 20 mg by mouth daily.   Yes Historical Provider, MD  minoxidil (LONITEN) 2.5  MG tablet Take 5 mg by mouth at bedtime.   Yes Historical Provider, MD  mometasone (NASONEX) 50 MCG/ACT nasal spray Place 2 sprays into the nose daily as needed (Allergies).   Yes Historical Provider, MD   BP 152/101  Pulse 77  Temp(Src) 97.6 F (36.4 C) (Oral)  Resp 16  Ht 6\' 2"  (1.88 m)  Wt 170 lb (77.111 kg)   BMI 21.82 kg/m2  SpO2 95% Physical Exam  Nursing note and vitals reviewed. Constitutional: He appears well-developed and well-nourished. No distress.  Awake, alert, nontoxic appearance  HENT:  Head: Normocephalic and atraumatic.  Mouth/Throat: Oropharynx is clear and moist. No oropharyngeal exudate.  Eyes: Conjunctivae are normal. No scleral icterus.  Neck: Normal range of motion. Neck supple.  Cardiovascular: Normal rate, regular rhythm, normal heart sounds and intact distal pulses.   No murmur heard. Capillary refill <3 sec Fingers are warm bilaterally  Pulmonary/Chest: Effort normal and breath sounds normal. No respiratory distress. He has no wheezes.  Equal chest expansion  Abdominal: Soft. Bowel sounds are normal. He exhibits no mass. There is no tenderness. There is no rebound and no guarding.  Musculoskeletal: Normal range of motion. He exhibits tenderness. He exhibits no edema.  ROM: Decreased range of motion to the left elbow Fistula left upper arm with palpable thrill and significant swelling to the distal end with TTP of the swelling; no erythema or induration  Neurological: He is alert. Coordination normal.  Speech is clear and goal oriented Moves extremities without ataxia Sensation intact to dull and sharp  Skin: Skin is warm and dry. He is not diaphoretic.  No tenting of the skin  Psychiatric: He has a normal mood and affect.    ED Course  Procedures (including critical care time) Labs Review Labs Reviewed - No data to display  Imaging Review No results found.   EKG Interpretation None      MDM   Final diagnoses:  Left arm swelling  Complication of arteriovenous dialysis fistula, initial encounter   Curtis Rangel presents with swelling to the distal aspect of his fistula. Concern for possible infection versus aneurysm vs leakage. Will obtain a fistulogram and consult with vascular surgery.    11:07 PM Discussed with Dr. Donnetta Hutching who will evaluate,  does not believe the patient needs a fistulogram.    11:52 PM Pt evaluated by vascular who believes swelling is from a back wall stick.  Recommends mild compression with ACE and d/c home with follow-up.  BP 152/101  Pulse 77  Temp(Src) 97.6 F (36.4 C) (Oral)  Resp 16  Ht 6\' 2"  (1.88 m)  Wt 170 lb (77.111 kg)  BMI 21.82 kg/m2  SpO2 95%  The patient was discussed with and seen by Dr. Doy Mince who agrees with the treatment plan.   Jarrett Soho Moxon Messler, PA-C 05/31/14 [place], PA-C 05/31/14 2357

## 2014-06-01 DIAGNOSIS — T82898A Other specified complication of vascular prosthetic devices, implants and grafts, initial encounter: Secondary | ICD-10-CM | POA: Diagnosis not present

## 2014-06-01 NOTE — ED Provider Notes (Signed)
Medical screening examination/treatment/procedure(s) were conducted as a shared visit with non-physician practitioner(s) and myself.  I personally evaluated the patient during the encounter.   EKG Interpretation None        Arbie Cookey, MD 06/01/14 [phone]

## 2014-06-01 NOTE — ED Provider Notes (Signed)
Medical screening examination/treatment/procedure(s) were conducted as a shared visit with non-physician practitioner(s) and myself.  I personally evaluated the patient during the encounter.   EKG Interpretation None      38 year old male with a dialysis fistula in his left upper extremity who presents with swelling in the area his fistula. On exam, well appearing, nontoxic, not distressed, normal respiratory effort, normal perfusion, left upper extremity with fistula with palpable thrill and good distal pulses, with moderate swelling and mild tenderness around the area. No warmth, erythema, induration, or fluctuance.  Dr. Donnetta Hutching saw pt in ED and recommended Ace wrap (not too tight) in to followup at his next dialysis session.  Clinical Impression: 1. Complication of arteriovenous dialysis fistula, initial encounter   2. Left arm swelling       Curtis Siren III, MD 06/01/14 [phone]

## 2014-06-02 ENCOUNTER — Telehealth: Payer: Self-pay

## 2014-06-02 ENCOUNTER — Telehealth: Payer: Self-pay | Admitting: Vascular Surgery

## 2014-06-02 DIAGNOSIS — N186 End stage renal disease: Secondary | ICD-10-CM | POA: Diagnosis not present

## 2014-06-02 DIAGNOSIS — D631 Anemia in chronic kidney disease: Secondary | ICD-10-CM | POA: Diagnosis not present

## 2014-06-02 NOTE — Telephone Encounter (Signed)
Phone call from [facility].  Reported that Dr. Justin Mend is requesting appt. For pt. Due to worsening of swelling of left arm Basilic Vein Fistula site, and bruising post hemodialysis treatment today.  Reported pt. developed a knot after tx today.  Appt. to be scheduled for 9/22 with Dr. Donnetta Hutching.  Will notify pt. and [place].

## 2014-06-02 NOTE — Telephone Encounter (Signed)
Message copied by Gena Fray on Mon Jun 02, 2014  1:56 PM ------      Message from: Peter Minium K      Created: Mon Jun 02, 2014  9:41 AM      Regarding: Schedule       FYI no follow up       ----- Message -----         From: Rosetta Posner, MD         Sent: 06/01/2014  12:01 AM           To: [facility] Charge Pool            Mid-level consult in the ED on Saturday night for swelling around his AV fistula. He does not need followup office       ------

## 2014-06-03 ENCOUNTER — Encounter: Payer: Self-pay | Admitting: Vascular Surgery

## 2014-06-03 ENCOUNTER — Ambulatory Visit (INDEPENDENT_AMBULATORY_CARE_PROVIDER_SITE_OTHER): Payer: Medicare Other | Admitting: Vascular Surgery

## 2014-06-03 VITALS — BP 128/76 | HR 90 | Temp 98.7°F | Resp 16 | Wt 159.0 lb

## 2014-06-03 DIAGNOSIS — T82598A Other mechanical complication of other cardiac and vascular devices and implants, initial encounter: Secondary | ICD-10-CM | POA: Diagnosis not present

## 2014-06-03 DIAGNOSIS — N186 End stage renal disease: Secondary | ICD-10-CM

## 2014-06-03 NOTE — Progress Notes (Addendum)
Patient name: Curtis Rangel MRN: [id number] DOB: 1976/07/10 Sex: male  Referred by: EDP  Reason for referral:    Chief Complaint   Patient presents with   .  Arm Swelling     HISTORY OF PRESENT ILLNESS:  The patient is a very pleasant gentleman with a long history of end-stage renal disease. He currently undergoes dialysis at [facility]. He had dialysis without incident 3 days ago. When he awoke this morning he noticed swelling at his left upper arm AV fistula. He did get to dialysis and had successful.also supposed morning. He was concerned regarding the swelling and presented to the emergency room tonight. He has some discomfort with this but this is tolerable. He had a prior history of a forearm fistula and a successful transplant but uses for sure what years. The forearm fistula thrombosed after some trivial trauma in 2010. He then went on to renal failure and had placement of a new upper arm graft approximately 2011. He has had no difficulty since that time.    Pt presents back in our office today with another infiltration of his fistula during his dialysis treatment yesterday.  This makes the 2nd infiltration in a week.  He states they are not having difficulty sticking the fistula and it infiltrates at the end of the HD run.  He states that he has an appointment at [facility] for workup for transplant on 06/10/14.   Past Medical History   Diagnosis  Date   .  Hypertension    .  Kidney failure    .  Dialysis patient    History reviewed. No pertinent past surgical history.  History     Social History   .  Marital Status:  Single     Spouse Name:  N/A     Number of Children:  N/A   .  Years of Education:  N/A    Occupational History   .  Not on file.    Social History Main Topics   .  Smoking status:  Current Every Day Smoker -- 0.50 packs/day   .  Smokeless tobacco:  Not on file   .  Alcohol Use:  No   .  Drug Use:  No   .  Sexual Activity:     Other  Topics  Concern   .  Not on file    Social History Narrative   .  No narrative on file   No family history on file.  Allergies as of 05/31/2014   .  (No Known Allergies)    No current facility-administered medications on file prior to encounter.     Current Outpatient Prescriptions on File Prior to Encounter   Medication  Sig  Dispense  Refill   .  calcium acetate (PHOSLO) 667 MG capsule  Take 667-2,001 mg by mouth 4 (four) times daily. Takes 3 capsules with every meal and takes 1 capsule with each snack.     Marland Kitchen  labetalol (NORMODYNE) 200 MG tablet  Take 200 mg by mouth 2 (two) times daily.     Marland Kitchen  lisinopril (PRINIVIL,ZESTRIL) 20 MG tablet  Take 20 mg by mouth daily.      ROS: [x]  Positive   [ ]  Negative   [ ]  All sytems reviewed and are negative  Cardiovascular: []  chest pain/pressure []  palpitations []  SOB lying flat []  DOE []  pain in legs while walking []  pain in feet when lying flat []  hx of DVT []   hx of phlebitis []  swelling in legs []  varicose veins  Pulmonary: []  productive cough [x]  asthma []  wheezing  Neurologic: []  weakness in []  arms []  legs []  numbness in []  arms []  legs [] difficulty speaking or slurred speech []  temporary loss of vision in one eye []  dizziness  Hematologic: []  bleeding problems []  problems with blood clotting easily  GI []  vomiting blood []  blood in stool  GU: []  burning with urination []  blood in urine [x]  ESRD with HD on M/W/F  Psychiatric: []  hx of major depression  Integumentary: []  rashes []  ulcers  Constitutional: []  fever []  chills  Filed Vitals:   06/03/14 1610  BP: 128/76  Pulse: 90  Temp: 98.7 F (37.1 C)  Resp: 16     PHYSICAL EXAMINATION:  General: The patient is a well-nourished male, in no acute distress.  Pulmonary: There is a good air exchange  Musculoskeletal: There are no major deformities. There is no significant extremity pain.  Neurologic: No focal weakness or paresthesias are detected,    Skin: There are no ulcer or rashes noted.  Psychiatric: The patient has normal affect.  Cardiovascular: Palpable left radial pulse. The saphenous thrombosed all the forearm fistula. He does have a well-developed excellent per arm cephalic vein brachial cephalic fistula  There is swelling posterior to the fistula above the antecubital space. There is mild tenderness to this. There is no expansile nature to this. There is no current bruising. New infiltration of proximal portion of fistula.  There continues to be a bruit/thrill within the fistula.  Impression and Plan:  I discussed this with the patient and his family present. No evidence of ongoing extravasation of blood. He does appear to have had an infiltration. The timing of this is somewhat unusual. He had successful dialysis 3 days ago and noted swelling on awakening this morning. Fortunately he was able to dialyze successfully. This is posterior to the fistula. I suspect that he in all likelihood had a back wall of the fistula puncture with some bleeding behind this. There is no suggestion of false aneurysm of physical exam. Discussed this with Dr.Wofford.  I would place an Ace wrap over this for some security but not snug enough to occlude his fistula. He will follow up with dialysis in 2 days. He will notify our office should he have any progressive difficulty  Pt presents back to our office today with a new infiltration of the proximal portion of his fistula after presenting to the ED on 05/31/14 for infiltration.  Hard to explain why all of a sudden he is having infiltration of his fistula as it has been working well for a while now.  He continues to have a bruit/thrill within the fistula.  Continue using fistula and we will schedule him for a left arm fistulogram on 06/12/14.  Leontine Locket 06/03/2014 4:59 PM  I have examined the patient, reviewed and agree with above. An unusual situation. Has had 2 infiltrations now in the past 2 weeks.  Unclear as to the cause of this. He continues excellent thrill reports no hot venous pressures. Have recommended a sonogram to rule out any central stenosis or occlusion as the cause of this. We will schedule this at his convenience next week on a nondialysis day  EARLY, TODD, MD 06/03/2014 5:08 PM

## 2014-06-04 DIAGNOSIS — D631 Anemia in chronic kidney disease: Secondary | ICD-10-CM | POA: Diagnosis not present

## 2014-06-04 DIAGNOSIS — N186 End stage renal disease: Secondary | ICD-10-CM | POA: Diagnosis not present

## 2014-06-06 ENCOUNTER — Encounter (HOSPITAL_COMMUNITY): Payer: Self-pay | Admitting: Pharmacy Technician

## 2014-06-07 DIAGNOSIS — N186 End stage renal disease: Secondary | ICD-10-CM | POA: Diagnosis not present

## 2014-06-07 DIAGNOSIS — D631 Anemia in chronic kidney disease: Secondary | ICD-10-CM | POA: Diagnosis not present

## 2014-06-09 ENCOUNTER — Other Ambulatory Visit: Payer: Self-pay

## 2014-06-09 DIAGNOSIS — D631 Anemia in chronic kidney disease: Secondary | ICD-10-CM | POA: Diagnosis not present

## 2014-06-09 DIAGNOSIS — N186 End stage renal disease: Secondary | ICD-10-CM | POA: Diagnosis not present

## 2014-06-10 MED ORDER — SODIUM CHLORIDE 0.9 % IJ SOLN: 3.0000 mL | INTRAMUSCULAR | Status: DC | PRN

## 2014-06-11 ENCOUNTER — Ambulatory Visit (HOSPITAL_COMMUNITY)
Admission: RE | Admit: 2014-06-11 | Discharge: 2014-06-11 | Disposition: A | Discharge status: Home / Self Care | Payer: Medicare Other | Source: Ambulatory Visit | Attending: Vascular Surgery | Admitting: Vascular Surgery

## 2014-06-11 ENCOUNTER — Encounter (HOSPITAL_COMMUNITY)
Admission: RE | Disposition: A | Discharge status: Home / Self Care | Payer: Self-pay | Source: Ambulatory Visit | Attending: Vascular Surgery

## 2014-06-11 DIAGNOSIS — Y849 Medical procedure, unspecified as the cause of abnormal reaction of the patient, or of later complication, without mention of misadventure at the time of the procedure: Secondary | ICD-10-CM | POA: Diagnosis not present

## 2014-06-11 DIAGNOSIS — N186 End stage renal disease: Secondary | ICD-10-CM | POA: Diagnosis present

## 2014-06-11 DIAGNOSIS — N184 Chronic kidney disease, stage 4 (severe): Secondary | ICD-10-CM | POA: Diagnosis not present

## 2014-06-11 DIAGNOSIS — T82898A Other specified complication of vascular prosthetic devices, implants and grafts, initial encounter: Secondary | ICD-10-CM | POA: Diagnosis not present

## 2014-06-11 HISTORY — PX: FISTULOGRAM: SHX5832

## 2014-06-11 LAB — POCT I-STAT, CHEM 8
BUN: 51 mg/dL — AB (ref 6–23)
CALCIUM ION: 0.94 mmol/L — AB (ref 1.12–1.23)
CHLORIDE: 93 meq/L — AB (ref 96–112)
Creatinine, Ser: 13 mg/dL — ABNORMAL HIGH (ref 0.50–1.35)
GLUCOSE: 91 mg/dL (ref 70–99)
HCT: 38 % — ABNORMAL LOW (ref 39.0–52.0)
Hemoglobin: 12.9 g/dL — ABNORMAL LOW (ref 13.0–17.0)
Potassium: 5.3 mEq/L (ref 3.7–5.3)
Sodium: 128 mEq/L — ABNORMAL LOW (ref 137–147)
TCO2: 21 mmol/L (ref 0–100)

## 2014-06-11 SURGERY — FISTULOGRAM
Anesthesia: LOCAL | Laterality: Left

## 2014-06-11 MED ORDER — SODIUM CHLORIDE 0.9 % IJ SOLN: 3.0000 mL | Freq: Two times a day (BID) | INTRAMUSCULAR | Status: DC

## 2014-06-11 MED ORDER — LIDOCAINE HCL (PF) 1 % IJ SOLN
INTRAMUSCULAR | Status: AC
  Filled 2014-06-11: qty 30

## 2014-06-11 MED ORDER — SODIUM CHLORIDE 0.9 % IJ SOLN: 3.0000 mL | INTRAMUSCULAR | Status: DC | PRN

## 2014-06-11 MED ORDER — SODIUM CHLORIDE 0.9 % IV SOLN: 250.0000 mL | INTRAVENOUS | Status: DC | PRN

## 2014-06-11 MED ORDER — HEPARIN (PORCINE) IN NACL 2-0.9 UNIT/ML-% IJ SOLN
INTRAMUSCULAR | Status: AC
Start: 2014-06-11 — End: 2014-06-11
  Filled 2014-06-11: qty 500

## 2014-06-11 NOTE — Discharge Instructions (Signed)
Fistulogram, Care After °Refer to this sheet in the next few weeks. These instructions provide you with information on caring for yourself after your procedure. Your health care provider may also give you more specific instructions. Your treatment has been planned according to current medical practices, but problems sometimes occur. Call your health care provider if you have any problems or questions after your procedure. °WHAT TO EXPECT AFTER THE PROCEDURE °After your procedure, it is typical to have the following: °· A small amount of discomfort in the area where the catheters were placed. °· A small amount of bruising around the fistula. °· Sleepiness and fatigue. °HOME CARE INSTRUCTIONS °· Rest at home for the day following your procedure. °· Do not drive or operate heavy machinery while taking pain medicine. °· Take medicines only as directed by your health care provider. °· Do not take baths, swim, or use a hot tub until your health care provider approves. You may shower 24 hours after the procedure or as directed by your health care provider. °· There are many different ways to close and cover an incision, including stitches, skin glue, and adhesive strips. Follow your health care provider's instructions on: °¨ Incision care. °¨ Bandage (dressing) changes and removal. °¨ Incision closure removal. °· Monitor your dialysis fistula carefully. °SEEK MEDICAL CARE IF: °· You have drainage, redness, swelling, or pain at your catheter site. °· You have a fever. °· You have chills. °SEEK IMMEDIATE MEDICAL CARE IF: °· You feel weak. °· You have trouble balancing. °· You have trouble moving your arms or legs. °· You have problems with your speech or vision. °· You can no longer feel a vibration or buzz when you put your fingers over your dialysis fistula. °· The limb that was used for the procedure: °¨ Swells. °¨ Is painful. °¨ Is cold. °¨ Is discolored, such as blue or pale white. °Document Released: 01/13/2014  Document Reviewed: 10/18/2013 °ExitCare® Patient Information ©2015 [employer]. This information is not intended to replace advice given to you by your health care provider. Make sure you discuss any questions you have with your health care provider. ° °

## 2014-06-11 NOTE — Op Note (Signed)
    OPERATIVE REPORT  DATE OF SURGERY: 06/11/2014  PATIENT: Curtis Rangel, 38 y.o. 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  PHYSICIAN ASSISTANT: Nurse  ANESTHESIA:  1% lidocaine local  EBL: Minimal ml     BLOOD ADMINISTERED: None  DRAINS: None  SPECIMEN: None  COUNTS CORRECT:  YES  PLAN OF CARE: Discharge home   PATIENT DISPOSITION:  PACU - hemodynamically stable  PROCEDURE DETAILS: The patient was taken to the PV lab and placed in supine position. The left arm was prepped and draped in usual sterile fashion. Using 1% lidocaine local the left AV fistula was accessed with a micropuncture technique and the distal upper arm. Hand injections were used to visualize the fistula. The entire central drainage was visualized there was no evidence of any central stenosis. There was a high-grade stenosis in the upper arm near the axilla. Decision was made to dilate this. The micropuncture catheter sheath was exchanged for an 8 [place] sheath. A 14 x 4 Atlas balloon was positioned at the level of the stenosis and inflated to 10 atmospheres for 1 minute. This did waste that was resolved after inflation. The repeat hand injection revealed improvement with some stenosis. For this reason a 14 balloon was removed and a 16 mm x 14 cm balloon was positioned at the stenosis and this was inflated. Repeat injection showed excellent result. The balloon was removed. A 3-0 nylon suture was placed around the sheath and a pursestring configuration. The sheath was removed and the perfect suture was tightened. This gave excellent hemostasis. The patient was transferred to the holding area in stable condition   Curt Jews, M.D. 06/11/2014 1:02  PM

## 2014-06-12 DIAGNOSIS — Z23 Encounter for immunization: Secondary | ICD-10-CM | POA: Diagnosis not present

## 2014-06-12 DIAGNOSIS — N186 End stage renal disease: Secondary | ICD-10-CM | POA: Diagnosis not present

## 2014-06-12 DIAGNOSIS — D631 Anemia in chronic kidney disease: Secondary | ICD-10-CM | POA: Diagnosis not present

## 2014-06-17 DIAGNOSIS — R9431 Abnormal electrocardiogram [ECG] [EKG]: Secondary | ICD-10-CM | POA: Diagnosis not present

## 2014-06-17 DIAGNOSIS — T59812A Toxic effect of smoke, intentional self-harm, initial encounter: Secondary | ICD-10-CM | POA: Diagnosis not present

## 2014-06-17 DIAGNOSIS — Z7682 Awaiting organ transplant status: Secondary | ICD-10-CM | POA: Diagnosis not present

## 2014-06-17 DIAGNOSIS — N186 End stage renal disease: Secondary | ICD-10-CM | POA: Diagnosis not present

## 2014-06-17 DIAGNOSIS — Z01811 Encounter for preprocedural respiratory examination: Secondary | ICD-10-CM | POA: Diagnosis not present

## 2014-06-17 DIAGNOSIS — Z992 Dependence on renal dialysis: Secondary | ICD-10-CM | POA: Diagnosis not present

## 2014-07-12 DIAGNOSIS — Z992 Dependence on renal dialysis: Secondary | ICD-10-CM | POA: Diagnosis not present

## 2014-07-12 DIAGNOSIS — N186 End stage renal disease: Secondary | ICD-10-CM | POA: Diagnosis not present

## 2014-07-15 DIAGNOSIS — D631 Anemia in chronic kidney disease: Secondary | ICD-10-CM | POA: Diagnosis not present

## 2014-07-15 DIAGNOSIS — N186 End stage renal disease: Secondary | ICD-10-CM | POA: Diagnosis not present

## 2014-07-15 DIAGNOSIS — D509 Iron deficiency anemia, unspecified: Secondary | ICD-10-CM | POA: Diagnosis not present

## 2014-08-11 DIAGNOSIS — N186 End stage renal disease: Secondary | ICD-10-CM | POA: Diagnosis not present

## 2014-08-11 DIAGNOSIS — Z992 Dependence on renal dialysis: Secondary | ICD-10-CM | POA: Diagnosis not present

## 2014-08-12 DIAGNOSIS — D631 Anemia in chronic kidney disease: Secondary | ICD-10-CM | POA: Diagnosis not present

## 2014-08-12 DIAGNOSIS — D509 Iron deficiency anemia, unspecified: Secondary | ICD-10-CM | POA: Diagnosis not present

## 2014-08-12 DIAGNOSIS — N186 End stage renal disease: Secondary | ICD-10-CM | POA: Diagnosis not present

## 2014-08-21 ENCOUNTER — Encounter (HOSPITAL_COMMUNITY): Payer: Self-pay | Admitting: Vascular Surgery

## 2014-09-06 DIAGNOSIS — I10 Essential (primary) hypertension: Secondary | ICD-10-CM | POA: Diagnosis not present

## 2014-09-06 DIAGNOSIS — W1843XA Slipping, tripping and stumbling without falling due to stepping from one level to another, initial encounter: Secondary | ICD-10-CM | POA: Diagnosis not present

## 2014-09-06 DIAGNOSIS — M7989 Other specified soft tissue disorders: Secondary | ICD-10-CM | POA: Diagnosis not present

## 2014-09-06 DIAGNOSIS — M25571 Pain in right ankle and joints of right foot: Secondary | ICD-10-CM | POA: Diagnosis not present

## 2014-09-06 DIAGNOSIS — T8610 Unspecified complication of kidney transplant: Secondary | ICD-10-CM | POA: Diagnosis not present

## 2014-09-06 DIAGNOSIS — F17210 Nicotine dependence, cigarettes, uncomplicated: Secondary | ICD-10-CM | POA: Diagnosis not present

## 2014-09-06 DIAGNOSIS — S93401A Sprain of unspecified ligament of right ankle, initial encounter: Secondary | ICD-10-CM | POA: Diagnosis not present

## 2014-09-06 DIAGNOSIS — M79671 Pain in right foot: Secondary | ICD-10-CM | POA: Diagnosis not present

## 2014-09-11 DIAGNOSIS — N186 End stage renal disease: Secondary | ICD-10-CM | POA: Diagnosis not present

## 2014-09-11 DIAGNOSIS — Z992 Dependence on renal dialysis: Secondary | ICD-10-CM | POA: Diagnosis not present

## 2014-09-13 DIAGNOSIS — D631 Anemia in chronic kidney disease: Secondary | ICD-10-CM | POA: Diagnosis not present

## 2014-09-13 DIAGNOSIS — N186 End stage renal disease: Secondary | ICD-10-CM | POA: Diagnosis not present

## 2014-10-12 DIAGNOSIS — Z992 Dependence on renal dialysis: Secondary | ICD-10-CM | POA: Diagnosis not present

## 2014-10-12 DIAGNOSIS — N186 End stage renal disease: Secondary | ICD-10-CM | POA: Diagnosis not present

## 2014-10-14 DIAGNOSIS — D631 Anemia in chronic kidney disease: Secondary | ICD-10-CM | POA: Diagnosis not present

## 2014-10-14 DIAGNOSIS — N186 End stage renal disease: Secondary | ICD-10-CM | POA: Diagnosis not present

## 2014-11-10 DIAGNOSIS — N186 End stage renal disease: Secondary | ICD-10-CM | POA: Diagnosis not present

## 2014-11-10 DIAGNOSIS — Z992 Dependence on renal dialysis: Secondary | ICD-10-CM | POA: Diagnosis not present

## 2014-11-11 DIAGNOSIS — D509 Iron deficiency anemia, unspecified: Secondary | ICD-10-CM | POA: Diagnosis not present

## 2014-11-11 DIAGNOSIS — N186 End stage renal disease: Secondary | ICD-10-CM | POA: Diagnosis not present

## 2014-11-11 DIAGNOSIS — D631 Anemia in chronic kidney disease: Secondary | ICD-10-CM | POA: Diagnosis not present

## 2014-12-11 DIAGNOSIS — N186 End stage renal disease: Secondary | ICD-10-CM | POA: Diagnosis not present

## 2014-12-11 DIAGNOSIS — T8690 Unspecified complication of unspecified transplanted organ and tissue: Secondary | ICD-10-CM | POA: Diagnosis not present

## 2014-12-11 DIAGNOSIS — Z992 Dependence on renal dialysis: Secondary | ICD-10-CM | POA: Diagnosis not present

## 2014-12-13 DIAGNOSIS — N186 End stage renal disease: Secondary | ICD-10-CM | POA: Diagnosis not present

## 2014-12-13 DIAGNOSIS — D631 Anemia in chronic kidney disease: Secondary | ICD-10-CM | POA: Diagnosis not present

## 2014-12-13 DIAGNOSIS — D509 Iron deficiency anemia, unspecified: Secondary | ICD-10-CM | POA: Diagnosis not present

## 2015-01-06 DIAGNOSIS — Z87891 Personal history of nicotine dependence: Secondary | ICD-10-CM | POA: Diagnosis not present

## 2015-01-06 DIAGNOSIS — S4992XA Unspecified injury of left shoulder and upper arm, initial encounter: Secondary | ICD-10-CM | POA: Diagnosis not present

## 2015-01-06 DIAGNOSIS — W01198A Fall on same level from slipping, tripping and stumbling with subsequent striking against other object, initial encounter: Secondary | ICD-10-CM | POA: Diagnosis not present

## 2015-01-06 DIAGNOSIS — S43402A Unspecified sprain of left shoulder joint, initial encounter: Secondary | ICD-10-CM | POA: Diagnosis not present

## 2015-01-06 DIAGNOSIS — T8610 Unspecified complication of kidney transplant: Secondary | ICD-10-CM | POA: Diagnosis not present

## 2015-01-06 DIAGNOSIS — N186 End stage renal disease: Secondary | ICD-10-CM | POA: Diagnosis not present

## 2015-01-06 DIAGNOSIS — S46012A Strain of muscle(s) and tendon(s) of the rotator cuff of left shoulder, initial encounter: Secondary | ICD-10-CM | POA: Diagnosis not present

## 2015-01-06 DIAGNOSIS — F419 Anxiety disorder, unspecified: Secondary | ICD-10-CM | POA: Diagnosis not present

## 2015-01-06 DIAGNOSIS — I120 Hypertensive chronic kidney disease with stage 5 chronic kidney disease or end stage renal disease: Secondary | ICD-10-CM | POA: Diagnosis not present

## 2015-01-06 DIAGNOSIS — G2581 Restless legs syndrome: Secondary | ICD-10-CM | POA: Diagnosis not present

## 2015-01-06 DIAGNOSIS — Z992 Dependence on renal dialysis: Secondary | ICD-10-CM | POA: Diagnosis not present

## 2015-01-06 DIAGNOSIS — M25512 Pain in left shoulder: Secondary | ICD-10-CM | POA: Diagnosis not present

## 2015-01-10 DIAGNOSIS — T8690 Unspecified complication of unspecified transplanted organ and tissue: Secondary | ICD-10-CM | POA: Diagnosis not present

## 2015-01-10 DIAGNOSIS — N186 End stage renal disease: Secondary | ICD-10-CM | POA: Diagnosis not present

## 2015-01-10 DIAGNOSIS — Z992 Dependence on renal dialysis: Secondary | ICD-10-CM | POA: Diagnosis not present

## 2015-01-13 DIAGNOSIS — N186 End stage renal disease: Secondary | ICD-10-CM | POA: Diagnosis not present

## 2015-01-13 DIAGNOSIS — D631 Anemia in chronic kidney disease: Secondary | ICD-10-CM | POA: Diagnosis not present

## 2015-02-10 DIAGNOSIS — Z992 Dependence on renal dialysis: Secondary | ICD-10-CM | POA: Diagnosis not present

## 2015-02-10 DIAGNOSIS — N186 End stage renal disease: Secondary | ICD-10-CM | POA: Diagnosis not present

## 2015-02-10 DIAGNOSIS — T8690 Unspecified complication of unspecified transplanted organ and tissue: Secondary | ICD-10-CM | POA: Diagnosis not present

## 2015-02-12 DIAGNOSIS — D631 Anemia in chronic kidney disease: Secondary | ICD-10-CM | POA: Diagnosis not present

## 2015-02-12 DIAGNOSIS — N186 End stage renal disease: Secondary | ICD-10-CM | POA: Diagnosis not present

## 2015-02-17 DIAGNOSIS — N186 End stage renal disease: Secondary | ICD-10-CM | POA: Diagnosis not present

## 2015-02-17 DIAGNOSIS — R05 Cough: Secondary | ICD-10-CM | POA: Diagnosis not present

## 2015-02-17 DIAGNOSIS — G2581 Restless legs syndrome: Secondary | ICD-10-CM | POA: Diagnosis not present

## 2015-02-17 DIAGNOSIS — R091 Pleurisy: Secondary | ICD-10-CM | POA: Diagnosis not present

## 2015-02-17 DIAGNOSIS — F419 Anxiety disorder, unspecified: Secondary | ICD-10-CM | POA: Diagnosis not present

## 2015-02-17 DIAGNOSIS — F17210 Nicotine dependence, cigarettes, uncomplicated: Secondary | ICD-10-CM | POA: Diagnosis not present

## 2015-02-17 DIAGNOSIS — Z992 Dependence on renal dialysis: Secondary | ICD-10-CM | POA: Diagnosis not present

## 2015-02-17 DIAGNOSIS — I120 Hypertensive chronic kidney disease with stage 5 chronic kidney disease or end stage renal disease: Secondary | ICD-10-CM | POA: Diagnosis not present

## 2015-03-12 DIAGNOSIS — Z992 Dependence on renal dialysis: Secondary | ICD-10-CM | POA: Diagnosis not present

## 2015-03-12 DIAGNOSIS — T8690 Unspecified complication of unspecified transplanted organ and tissue: Secondary | ICD-10-CM | POA: Diagnosis not present

## 2015-03-12 DIAGNOSIS — N186 End stage renal disease: Secondary | ICD-10-CM | POA: Diagnosis not present

## 2015-03-14 DIAGNOSIS — D631 Anemia in chronic kidney disease: Secondary | ICD-10-CM | POA: Diagnosis not present

## 2015-03-14 DIAGNOSIS — N186 End stage renal disease: Secondary | ICD-10-CM | POA: Diagnosis not present

## 2015-04-12 DIAGNOSIS — Z992 Dependence on renal dialysis: Secondary | ICD-10-CM | POA: Diagnosis not present

## 2015-04-12 DIAGNOSIS — N186 End stage renal disease: Secondary | ICD-10-CM | POA: Diagnosis not present

## 2015-04-12 DIAGNOSIS — T8690 Unspecified complication of unspecified transplanted organ and tissue: Secondary | ICD-10-CM | POA: Diagnosis not present

## 2015-04-14 DIAGNOSIS — D631 Anemia in chronic kidney disease: Secondary | ICD-10-CM | POA: Diagnosis not present

## 2015-04-14 DIAGNOSIS — D509 Iron deficiency anemia, unspecified: Secondary | ICD-10-CM | POA: Diagnosis not present

## 2015-04-14 DIAGNOSIS — N186 End stage renal disease: Secondary | ICD-10-CM | POA: Diagnosis not present

## 2015-05-13 DIAGNOSIS — T8690 Unspecified complication of unspecified transplanted organ and tissue: Secondary | ICD-10-CM | POA: Diagnosis not present

## 2015-05-13 DIAGNOSIS — Z992 Dependence on renal dialysis: Secondary | ICD-10-CM | POA: Diagnosis not present

## 2015-05-13 DIAGNOSIS — N186 End stage renal disease: Secondary | ICD-10-CM | POA: Diagnosis not present

## 2015-05-16 DIAGNOSIS — N186 End stage renal disease: Secondary | ICD-10-CM | POA: Diagnosis not present

## 2015-05-16 DIAGNOSIS — D509 Iron deficiency anemia, unspecified: Secondary | ICD-10-CM | POA: Diagnosis not present

## 2015-05-16 DIAGNOSIS — D631 Anemia in chronic kidney disease: Secondary | ICD-10-CM | POA: Diagnosis not present

## 2015-05-19 DIAGNOSIS — N186 End stage renal disease: Secondary | ICD-10-CM | POA: Diagnosis not present

## 2015-05-19 DIAGNOSIS — D631 Anemia in chronic kidney disease: Secondary | ICD-10-CM | POA: Diagnosis not present

## 2015-05-19 DIAGNOSIS — D509 Iron deficiency anemia, unspecified: Secondary | ICD-10-CM | POA: Diagnosis not present

## 2015-05-21 DIAGNOSIS — D509 Iron deficiency anemia, unspecified: Secondary | ICD-10-CM | POA: Diagnosis not present

## 2015-05-21 DIAGNOSIS — N186 End stage renal disease: Secondary | ICD-10-CM | POA: Diagnosis not present

## 2015-05-21 DIAGNOSIS — D631 Anemia in chronic kidney disease: Secondary | ICD-10-CM | POA: Diagnosis not present

## 2015-05-23 DIAGNOSIS — D631 Anemia in chronic kidney disease: Secondary | ICD-10-CM | POA: Diagnosis not present

## 2015-05-23 DIAGNOSIS — N186 End stage renal disease: Secondary | ICD-10-CM | POA: Diagnosis not present

## 2015-05-23 DIAGNOSIS — D509 Iron deficiency anemia, unspecified: Secondary | ICD-10-CM | POA: Diagnosis not present

## 2015-05-26 DIAGNOSIS — D631 Anemia in chronic kidney disease: Secondary | ICD-10-CM | POA: Diagnosis not present

## 2015-05-26 DIAGNOSIS — D509 Iron deficiency anemia, unspecified: Secondary | ICD-10-CM | POA: Diagnosis not present

## 2015-05-26 DIAGNOSIS — N186 End stage renal disease: Secondary | ICD-10-CM | POA: Diagnosis not present

## 2015-05-28 DIAGNOSIS — D631 Anemia in chronic kidney disease: Secondary | ICD-10-CM | POA: Diagnosis not present

## 2015-05-28 DIAGNOSIS — N186 End stage renal disease: Secondary | ICD-10-CM | POA: Diagnosis not present

## 2015-05-28 DIAGNOSIS — D509 Iron deficiency anemia, unspecified: Secondary | ICD-10-CM | POA: Diagnosis not present

## 2015-05-30 DIAGNOSIS — D509 Iron deficiency anemia, unspecified: Secondary | ICD-10-CM | POA: Diagnosis not present

## 2015-05-30 DIAGNOSIS — D631 Anemia in chronic kidney disease: Secondary | ICD-10-CM | POA: Diagnosis not present

## 2015-05-30 DIAGNOSIS — N186 End stage renal disease: Secondary | ICD-10-CM | POA: Diagnosis not present

## 2015-06-02 DIAGNOSIS — D509 Iron deficiency anemia, unspecified: Secondary | ICD-10-CM | POA: Diagnosis not present

## 2015-06-02 DIAGNOSIS — D631 Anemia in chronic kidney disease: Secondary | ICD-10-CM | POA: Diagnosis not present

## 2015-06-02 DIAGNOSIS — N186 End stage renal disease: Secondary | ICD-10-CM | POA: Diagnosis not present

## 2015-06-06 DIAGNOSIS — D509 Iron deficiency anemia, unspecified: Secondary | ICD-10-CM | POA: Diagnosis not present

## 2015-06-06 DIAGNOSIS — D631 Anemia in chronic kidney disease: Secondary | ICD-10-CM | POA: Diagnosis not present

## 2015-06-06 DIAGNOSIS — N186 End stage renal disease: Secondary | ICD-10-CM | POA: Diagnosis not present

## 2015-06-09 DIAGNOSIS — D631 Anemia in chronic kidney disease: Secondary | ICD-10-CM | POA: Diagnosis not present

## 2015-06-09 DIAGNOSIS — N186 End stage renal disease: Secondary | ICD-10-CM | POA: Diagnosis not present

## 2015-06-09 DIAGNOSIS — D509 Iron deficiency anemia, unspecified: Secondary | ICD-10-CM | POA: Diagnosis not present

## 2015-06-11 DIAGNOSIS — N186 End stage renal disease: Secondary | ICD-10-CM | POA: Diagnosis not present

## 2015-06-11 DIAGNOSIS — D509 Iron deficiency anemia, unspecified: Secondary | ICD-10-CM | POA: Diagnosis not present

## 2015-06-11 DIAGNOSIS — D631 Anemia in chronic kidney disease: Secondary | ICD-10-CM | POA: Diagnosis not present

## 2015-06-12 DIAGNOSIS — Z992 Dependence on renal dialysis: Secondary | ICD-10-CM | POA: Diagnosis not present

## 2015-06-12 DIAGNOSIS — T8690 Unspecified complication of unspecified transplanted organ and tissue: Secondary | ICD-10-CM | POA: Diagnosis not present

## 2015-06-12 DIAGNOSIS — N186 End stage renal disease: Secondary | ICD-10-CM | POA: Diagnosis not present

## 2015-06-13 DIAGNOSIS — Z23 Encounter for immunization: Secondary | ICD-10-CM | POA: Diagnosis not present

## 2015-06-13 DIAGNOSIS — N186 End stage renal disease: Secondary | ICD-10-CM | POA: Diagnosis not present

## 2015-06-13 DIAGNOSIS — D631 Anemia in chronic kidney disease: Secondary | ICD-10-CM | POA: Diagnosis not present

## 2015-06-17 DIAGNOSIS — Z23 Encounter for immunization: Secondary | ICD-10-CM | POA: Diagnosis not present

## 2015-06-17 DIAGNOSIS — D631 Anemia in chronic kidney disease: Secondary | ICD-10-CM | POA: Diagnosis not present

## 2015-06-17 DIAGNOSIS — N186 End stage renal disease: Secondary | ICD-10-CM | POA: Diagnosis not present

## 2015-06-18 DIAGNOSIS — Z23 Encounter for immunization: Secondary | ICD-10-CM | POA: Diagnosis not present

## 2015-06-18 DIAGNOSIS — D631 Anemia in chronic kidney disease: Secondary | ICD-10-CM | POA: Diagnosis not present

## 2015-06-18 DIAGNOSIS — N186 End stage renal disease: Secondary | ICD-10-CM | POA: Diagnosis not present

## 2015-06-20 DIAGNOSIS — D631 Anemia in chronic kidney disease: Secondary | ICD-10-CM | POA: Diagnosis not present

## 2015-06-20 DIAGNOSIS — Z23 Encounter for immunization: Secondary | ICD-10-CM | POA: Diagnosis not present

## 2015-06-20 DIAGNOSIS — N186 End stage renal disease: Secondary | ICD-10-CM | POA: Diagnosis not present

## 2015-06-22 DIAGNOSIS — N186 End stage renal disease: Secondary | ICD-10-CM | POA: Diagnosis not present

## 2015-06-22 DIAGNOSIS — Z23 Encounter for immunization: Secondary | ICD-10-CM | POA: Diagnosis not present

## 2015-06-22 DIAGNOSIS — D631 Anemia in chronic kidney disease: Secondary | ICD-10-CM | POA: Diagnosis not present

## 2015-06-25 DIAGNOSIS — D631 Anemia in chronic kidney disease: Secondary | ICD-10-CM | POA: Diagnosis not present

## 2015-06-25 DIAGNOSIS — Z23 Encounter for immunization: Secondary | ICD-10-CM | POA: Diagnosis not present

## 2015-06-25 DIAGNOSIS — N186 End stage renal disease: Secondary | ICD-10-CM | POA: Diagnosis not present

## 2015-06-27 DIAGNOSIS — N186 End stage renal disease: Secondary | ICD-10-CM | POA: Diagnosis not present

## 2015-06-27 DIAGNOSIS — D631 Anemia in chronic kidney disease: Secondary | ICD-10-CM | POA: Diagnosis not present

## 2015-06-27 DIAGNOSIS — Z23 Encounter for immunization: Secondary | ICD-10-CM | POA: Diagnosis not present

## 2015-06-30 DIAGNOSIS — D631 Anemia in chronic kidney disease: Secondary | ICD-10-CM | POA: Diagnosis not present

## 2015-06-30 DIAGNOSIS — N186 End stage renal disease: Secondary | ICD-10-CM | POA: Diagnosis not present

## 2015-06-30 DIAGNOSIS — Z23 Encounter for immunization: Secondary | ICD-10-CM | POA: Diagnosis not present

## 2015-07-02 DIAGNOSIS — Z23 Encounter for immunization: Secondary | ICD-10-CM | POA: Diagnosis not present

## 2015-07-02 DIAGNOSIS — D631 Anemia in chronic kidney disease: Secondary | ICD-10-CM | POA: Diagnosis not present

## 2015-07-02 DIAGNOSIS — N186 End stage renal disease: Secondary | ICD-10-CM | POA: Diagnosis not present

## 2015-07-04 DIAGNOSIS — Z23 Encounter for immunization: Secondary | ICD-10-CM | POA: Diagnosis not present

## 2015-07-04 DIAGNOSIS — D631 Anemia in chronic kidney disease: Secondary | ICD-10-CM | POA: Diagnosis not present

## 2015-07-04 DIAGNOSIS — N186 End stage renal disease: Secondary | ICD-10-CM | POA: Diagnosis not present

## 2015-07-07 DIAGNOSIS — Z23 Encounter for immunization: Secondary | ICD-10-CM | POA: Diagnosis not present

## 2015-07-07 DIAGNOSIS — D631 Anemia in chronic kidney disease: Secondary | ICD-10-CM | POA: Diagnosis not present

## 2015-07-07 DIAGNOSIS — N186 End stage renal disease: Secondary | ICD-10-CM | POA: Diagnosis not present

## 2015-07-09 DIAGNOSIS — Z23 Encounter for immunization: Secondary | ICD-10-CM | POA: Diagnosis not present

## 2015-07-09 DIAGNOSIS — N186 End stage renal disease: Secondary | ICD-10-CM | POA: Diagnosis not present

## 2015-07-09 DIAGNOSIS — D631 Anemia in chronic kidney disease: Secondary | ICD-10-CM | POA: Diagnosis not present

## 2015-07-11 DIAGNOSIS — D631 Anemia in chronic kidney disease: Secondary | ICD-10-CM | POA: Diagnosis not present

## 2015-07-11 DIAGNOSIS — Z23 Encounter for immunization: Secondary | ICD-10-CM | POA: Diagnosis not present

## 2015-07-11 DIAGNOSIS — N186 End stage renal disease: Secondary | ICD-10-CM | POA: Diagnosis not present

## 2015-07-13 DIAGNOSIS — N186 End stage renal disease: Secondary | ICD-10-CM | POA: Diagnosis not present

## 2015-07-13 DIAGNOSIS — T8690 Unspecified complication of unspecified transplanted organ and tissue: Secondary | ICD-10-CM | POA: Diagnosis not present

## 2015-07-13 DIAGNOSIS — Z992 Dependence on renal dialysis: Secondary | ICD-10-CM | POA: Diagnosis not present

## 2015-07-14 DIAGNOSIS — N186 End stage renal disease: Secondary | ICD-10-CM | POA: Diagnosis not present

## 2015-07-22 DIAGNOSIS — S6292XA Unspecified fracture of left wrist and hand, initial encounter for closed fracture: Secondary | ICD-10-CM | POA: Diagnosis not present

## 2015-07-22 DIAGNOSIS — S62132A Displaced fracture of capitate [os magnum] bone, left wrist, initial encounter for closed fracture: Secondary | ICD-10-CM | POA: Diagnosis not present

## 2015-07-22 DIAGNOSIS — F419 Anxiety disorder, unspecified: Secondary | ICD-10-CM | POA: Diagnosis not present

## 2015-07-22 DIAGNOSIS — N186 End stage renal disease: Secondary | ICD-10-CM | POA: Diagnosis not present

## 2015-07-22 DIAGNOSIS — I120 Hypertensive chronic kidney disease with stage 5 chronic kidney disease or end stage renal disease: Secondary | ICD-10-CM | POA: Diagnosis not present

## 2015-07-22 DIAGNOSIS — Z992 Dependence on renal dialysis: Secondary | ICD-10-CM | POA: Diagnosis not present

## 2015-07-22 DIAGNOSIS — G2581 Restless legs syndrome: Secondary | ICD-10-CM | POA: Diagnosis not present

## 2015-07-22 DIAGNOSIS — I10 Essential (primary) hypertension: Secondary | ICD-10-CM | POA: Diagnosis not present

## 2015-07-22 DIAGNOSIS — M1990 Unspecified osteoarthritis, unspecified site: Secondary | ICD-10-CM | POA: Diagnosis not present

## 2015-07-22 DIAGNOSIS — S6992XA Unspecified injury of left wrist, hand and finger(s), initial encounter: Secondary | ICD-10-CM | POA: Diagnosis not present

## 2015-07-22 DIAGNOSIS — T8612 Kidney transplant failure: Secondary | ICD-10-CM | POA: Diagnosis not present

## 2015-08-12 DIAGNOSIS — Z992 Dependence on renal dialysis: Secondary | ICD-10-CM | POA: Diagnosis not present

## 2015-08-12 DIAGNOSIS — T8690 Unspecified complication of unspecified transplanted organ and tissue: Secondary | ICD-10-CM | POA: Diagnosis not present

## 2015-08-12 DIAGNOSIS — N186 End stage renal disease: Secondary | ICD-10-CM | POA: Diagnosis not present

## 2015-08-13 DIAGNOSIS — N186 End stage renal disease: Secondary | ICD-10-CM | POA: Diagnosis not present

## 2015-08-17 DIAGNOSIS — N186 End stage renal disease: Secondary | ICD-10-CM | POA: Diagnosis not present

## 2015-08-20 DIAGNOSIS — N186 End stage renal disease: Secondary | ICD-10-CM | POA: Diagnosis not present

## 2015-08-22 DIAGNOSIS — N186 End stage renal disease: Secondary | ICD-10-CM | POA: Diagnosis not present

## 2015-08-26 DIAGNOSIS — N186 End stage renal disease: Secondary | ICD-10-CM | POA: Diagnosis not present

## 2015-08-29 DIAGNOSIS — N186 End stage renal disease: Secondary | ICD-10-CM | POA: Diagnosis not present

## 2015-09-01 DIAGNOSIS — N186 End stage renal disease: Secondary | ICD-10-CM | POA: Diagnosis not present

## 2015-09-03 DIAGNOSIS — N186 End stage renal disease: Secondary | ICD-10-CM | POA: Diagnosis not present

## 2015-09-08 DIAGNOSIS — N186 End stage renal disease: Secondary | ICD-10-CM | POA: Diagnosis not present

## 2015-09-10 DIAGNOSIS — N186 End stage renal disease: Secondary | ICD-10-CM | POA: Diagnosis not present

## 2015-09-12 DIAGNOSIS — T8690 Unspecified complication of unspecified transplanted organ and tissue: Secondary | ICD-10-CM | POA: Diagnosis not present

## 2015-09-12 DIAGNOSIS — Z992 Dependence on renal dialysis: Secondary | ICD-10-CM | POA: Diagnosis not present

## 2015-09-12 DIAGNOSIS — N186 End stage renal disease: Secondary | ICD-10-CM | POA: Diagnosis not present

## 2015-09-15 DIAGNOSIS — N186 End stage renal disease: Secondary | ICD-10-CM | POA: Diagnosis not present

## 2015-09-15 DIAGNOSIS — Z23 Encounter for immunization: Secondary | ICD-10-CM | POA: Diagnosis not present

## 2015-09-19 DIAGNOSIS — Z23 Encounter for immunization: Secondary | ICD-10-CM | POA: Diagnosis not present

## 2015-09-19 DIAGNOSIS — N186 End stage renal disease: Secondary | ICD-10-CM | POA: Diagnosis not present

## 2015-09-22 DIAGNOSIS — Z23 Encounter for immunization: Secondary | ICD-10-CM | POA: Diagnosis not present

## 2015-09-22 DIAGNOSIS — N186 End stage renal disease: Secondary | ICD-10-CM | POA: Diagnosis not present

## 2015-09-26 DIAGNOSIS — N186 End stage renal disease: Secondary | ICD-10-CM | POA: Diagnosis not present

## 2015-09-26 DIAGNOSIS — Z23 Encounter for immunization: Secondary | ICD-10-CM | POA: Diagnosis not present

## 2015-09-29 DIAGNOSIS — Z23 Encounter for immunization: Secondary | ICD-10-CM | POA: Diagnosis not present

## 2015-09-29 DIAGNOSIS — N186 End stage renal disease: Secondary | ICD-10-CM | POA: Diagnosis not present

## 2015-10-03 DIAGNOSIS — N186 End stage renal disease: Secondary | ICD-10-CM | POA: Diagnosis not present

## 2015-10-03 DIAGNOSIS — Z23 Encounter for immunization: Secondary | ICD-10-CM | POA: Diagnosis not present

## 2015-10-07 DIAGNOSIS — N186 End stage renal disease: Secondary | ICD-10-CM | POA: Diagnosis not present

## 2015-10-07 DIAGNOSIS — Z23 Encounter for immunization: Secondary | ICD-10-CM | POA: Diagnosis not present

## 2015-10-10 DIAGNOSIS — Z23 Encounter for immunization: Secondary | ICD-10-CM | POA: Diagnosis not present

## 2015-10-10 DIAGNOSIS — N186 End stage renal disease: Secondary | ICD-10-CM | POA: Diagnosis not present

## 2015-10-13 DIAGNOSIS — Z23 Encounter for immunization: Secondary | ICD-10-CM | POA: Diagnosis not present

## 2015-10-13 DIAGNOSIS — N186 End stage renal disease: Secondary | ICD-10-CM | POA: Diagnosis not present

## 2015-10-15 DIAGNOSIS — N186 End stage renal disease: Secondary | ICD-10-CM | POA: Diagnosis not present

## 2015-10-15 DIAGNOSIS — D509 Iron deficiency anemia, unspecified: Secondary | ICD-10-CM | POA: Diagnosis not present

## 2015-11-10 DIAGNOSIS — Z992 Dependence on renal dialysis: Secondary | ICD-10-CM | POA: Diagnosis not present

## 2015-11-10 DIAGNOSIS — T8690 Unspecified complication of unspecified transplanted organ and tissue: Secondary | ICD-10-CM | POA: Diagnosis not present

## 2015-11-10 DIAGNOSIS — N186 End stage renal disease: Secondary | ICD-10-CM | POA: Diagnosis not present

## 2015-11-13 DIAGNOSIS — D509 Iron deficiency anemia, unspecified: Secondary | ICD-10-CM | POA: Diagnosis not present

## 2015-11-13 DIAGNOSIS — Z23 Encounter for immunization: Secondary | ICD-10-CM | POA: Diagnosis not present

## 2015-11-13 DIAGNOSIS — D631 Anemia in chronic kidney disease: Secondary | ICD-10-CM | POA: Diagnosis not present

## 2015-11-13 DIAGNOSIS — N186 End stage renal disease: Secondary | ICD-10-CM | POA: Diagnosis not present

## 2015-11-17 DIAGNOSIS — Z23 Encounter for immunization: Secondary | ICD-10-CM | POA: Diagnosis not present

## 2015-11-17 DIAGNOSIS — D631 Anemia in chronic kidney disease: Secondary | ICD-10-CM | POA: Diagnosis not present

## 2015-11-17 DIAGNOSIS — D509 Iron deficiency anemia, unspecified: Secondary | ICD-10-CM | POA: Diagnosis not present

## 2015-11-17 DIAGNOSIS — N186 End stage renal disease: Secondary | ICD-10-CM | POA: Diagnosis not present

## 2015-11-20 DIAGNOSIS — D631 Anemia in chronic kidney disease: Secondary | ICD-10-CM | POA: Diagnosis not present

## 2015-11-20 DIAGNOSIS — Z23 Encounter for immunization: Secondary | ICD-10-CM | POA: Diagnosis not present

## 2015-11-20 DIAGNOSIS — N186 End stage renal disease: Secondary | ICD-10-CM | POA: Diagnosis not present

## 2015-11-20 DIAGNOSIS — D509 Iron deficiency anemia, unspecified: Secondary | ICD-10-CM | POA: Diagnosis not present

## 2015-11-23 DIAGNOSIS — D509 Iron deficiency anemia, unspecified: Secondary | ICD-10-CM | POA: Diagnosis not present

## 2015-11-23 DIAGNOSIS — D631 Anemia in chronic kidney disease: Secondary | ICD-10-CM | POA: Diagnosis not present

## 2015-11-23 DIAGNOSIS — N186 End stage renal disease: Secondary | ICD-10-CM | POA: Diagnosis not present

## 2015-11-23 DIAGNOSIS — Z23 Encounter for immunization: Secondary | ICD-10-CM | POA: Diagnosis not present

## 2015-11-24 DIAGNOSIS — N186 End stage renal disease: Secondary | ICD-10-CM | POA: Diagnosis not present

## 2015-11-24 DIAGNOSIS — D631 Anemia in chronic kidney disease: Secondary | ICD-10-CM | POA: Diagnosis not present

## 2015-11-24 DIAGNOSIS — Z23 Encounter for immunization: Secondary | ICD-10-CM | POA: Diagnosis not present

## 2015-11-24 DIAGNOSIS — D509 Iron deficiency anemia, unspecified: Secondary | ICD-10-CM | POA: Diagnosis not present

## 2015-11-28 DIAGNOSIS — D631 Anemia in chronic kidney disease: Secondary | ICD-10-CM | POA: Diagnosis not present

## 2015-11-28 DIAGNOSIS — D509 Iron deficiency anemia, unspecified: Secondary | ICD-10-CM | POA: Diagnosis not present

## 2015-11-28 DIAGNOSIS — Z23 Encounter for immunization: Secondary | ICD-10-CM | POA: Diagnosis not present

## 2015-11-28 DIAGNOSIS — N186 End stage renal disease: Secondary | ICD-10-CM | POA: Diagnosis not present

## 2015-12-01 DIAGNOSIS — D631 Anemia in chronic kidney disease: Secondary | ICD-10-CM | POA: Diagnosis not present

## 2015-12-01 DIAGNOSIS — Z23 Encounter for immunization: Secondary | ICD-10-CM | POA: Diagnosis not present

## 2015-12-01 DIAGNOSIS — D509 Iron deficiency anemia, unspecified: Secondary | ICD-10-CM | POA: Diagnosis not present

## 2015-12-01 DIAGNOSIS — N186 End stage renal disease: Secondary | ICD-10-CM | POA: Diagnosis not present

## 2015-12-03 DIAGNOSIS — D509 Iron deficiency anemia, unspecified: Secondary | ICD-10-CM | POA: Diagnosis not present

## 2015-12-03 DIAGNOSIS — Z23 Encounter for immunization: Secondary | ICD-10-CM | POA: Diagnosis not present

## 2015-12-03 DIAGNOSIS — N186 End stage renal disease: Secondary | ICD-10-CM | POA: Diagnosis not present

## 2015-12-03 DIAGNOSIS — D631 Anemia in chronic kidney disease: Secondary | ICD-10-CM | POA: Diagnosis not present

## 2015-12-05 DIAGNOSIS — Z23 Encounter for immunization: Secondary | ICD-10-CM | POA: Diagnosis not present

## 2015-12-05 DIAGNOSIS — D509 Iron deficiency anemia, unspecified: Secondary | ICD-10-CM | POA: Diagnosis not present

## 2015-12-05 DIAGNOSIS — N186 End stage renal disease: Secondary | ICD-10-CM | POA: Diagnosis not present

## 2015-12-05 DIAGNOSIS — D631 Anemia in chronic kidney disease: Secondary | ICD-10-CM | POA: Diagnosis not present

## 2015-12-09 DIAGNOSIS — D631 Anemia in chronic kidney disease: Secondary | ICD-10-CM | POA: Diagnosis not present

## 2015-12-09 DIAGNOSIS — N186 End stage renal disease: Secondary | ICD-10-CM | POA: Diagnosis not present

## 2015-12-09 DIAGNOSIS — D509 Iron deficiency anemia, unspecified: Secondary | ICD-10-CM | POA: Diagnosis not present

## 2015-12-09 DIAGNOSIS — Z23 Encounter for immunization: Secondary | ICD-10-CM | POA: Diagnosis not present

## 2015-12-11 DIAGNOSIS — T8690 Unspecified complication of unspecified transplanted organ and tissue: Secondary | ICD-10-CM | POA: Diagnosis not present

## 2015-12-11 DIAGNOSIS — N186 End stage renal disease: Secondary | ICD-10-CM | POA: Diagnosis not present

## 2015-12-11 DIAGNOSIS — Z992 Dependence on renal dialysis: Secondary | ICD-10-CM | POA: Diagnosis not present

## 2015-12-12 DIAGNOSIS — N186 End stage renal disease: Secondary | ICD-10-CM | POA: Diagnosis not present

## 2015-12-12 DIAGNOSIS — D631 Anemia in chronic kidney disease: Secondary | ICD-10-CM | POA: Diagnosis not present

## 2015-12-12 DIAGNOSIS — D509 Iron deficiency anemia, unspecified: Secondary | ICD-10-CM | POA: Diagnosis not present

## 2015-12-15 DIAGNOSIS — D509 Iron deficiency anemia, unspecified: Secondary | ICD-10-CM | POA: Diagnosis not present

## 2015-12-15 DIAGNOSIS — D631 Anemia in chronic kidney disease: Secondary | ICD-10-CM | POA: Diagnosis not present

## 2015-12-15 DIAGNOSIS — N186 End stage renal disease: Secondary | ICD-10-CM | POA: Diagnosis not present

## 2015-12-18 DIAGNOSIS — D631 Anemia in chronic kidney disease: Secondary | ICD-10-CM | POA: Diagnosis not present

## 2015-12-18 DIAGNOSIS — N186 End stage renal disease: Secondary | ICD-10-CM | POA: Diagnosis not present

## 2015-12-18 DIAGNOSIS — D509 Iron deficiency anemia, unspecified: Secondary | ICD-10-CM | POA: Diagnosis not present

## 2015-12-19 DIAGNOSIS — D509 Iron deficiency anemia, unspecified: Secondary | ICD-10-CM | POA: Diagnosis not present

## 2015-12-19 DIAGNOSIS — N186 End stage renal disease: Secondary | ICD-10-CM | POA: Diagnosis not present

## 2015-12-19 DIAGNOSIS — D631 Anemia in chronic kidney disease: Secondary | ICD-10-CM | POA: Diagnosis not present

## 2015-12-22 DIAGNOSIS — D631 Anemia in chronic kidney disease: Secondary | ICD-10-CM | POA: Diagnosis not present

## 2015-12-22 DIAGNOSIS — N186 End stage renal disease: Secondary | ICD-10-CM | POA: Diagnosis not present

## 2015-12-22 DIAGNOSIS — D509 Iron deficiency anemia, unspecified: Secondary | ICD-10-CM | POA: Diagnosis not present

## 2015-12-24 DIAGNOSIS — D631 Anemia in chronic kidney disease: Secondary | ICD-10-CM | POA: Diagnosis not present

## 2015-12-24 DIAGNOSIS — N186 End stage renal disease: Secondary | ICD-10-CM | POA: Diagnosis not present

## 2015-12-24 DIAGNOSIS — D509 Iron deficiency anemia, unspecified: Secondary | ICD-10-CM | POA: Diagnosis not present

## 2015-12-26 DIAGNOSIS — D631 Anemia in chronic kidney disease: Secondary | ICD-10-CM | POA: Diagnosis not present

## 2015-12-26 DIAGNOSIS — N186 End stage renal disease: Secondary | ICD-10-CM | POA: Diagnosis not present

## 2015-12-26 DIAGNOSIS — D509 Iron deficiency anemia, unspecified: Secondary | ICD-10-CM | POA: Diagnosis not present

## 2015-12-29 DIAGNOSIS — D509 Iron deficiency anemia, unspecified: Secondary | ICD-10-CM | POA: Diagnosis not present

## 2015-12-29 DIAGNOSIS — N186 End stage renal disease: Secondary | ICD-10-CM | POA: Diagnosis not present

## 2015-12-29 DIAGNOSIS — D631 Anemia in chronic kidney disease: Secondary | ICD-10-CM | POA: Diagnosis not present

## 2015-12-31 DIAGNOSIS — D509 Iron deficiency anemia, unspecified: Secondary | ICD-10-CM | POA: Diagnosis not present

## 2015-12-31 DIAGNOSIS — D631 Anemia in chronic kidney disease: Secondary | ICD-10-CM | POA: Diagnosis not present

## 2015-12-31 DIAGNOSIS — N186 End stage renal disease: Secondary | ICD-10-CM | POA: Diagnosis not present

## 2016-01-02 DIAGNOSIS — D509 Iron deficiency anemia, unspecified: Secondary | ICD-10-CM | POA: Diagnosis not present

## 2016-01-02 DIAGNOSIS — D631 Anemia in chronic kidney disease: Secondary | ICD-10-CM | POA: Diagnosis not present

## 2016-01-02 DIAGNOSIS — N186 End stage renal disease: Secondary | ICD-10-CM | POA: Diagnosis not present

## 2016-01-05 DIAGNOSIS — N186 End stage renal disease: Secondary | ICD-10-CM | POA: Diagnosis not present

## 2016-01-05 DIAGNOSIS — D509 Iron deficiency anemia, unspecified: Secondary | ICD-10-CM | POA: Diagnosis not present

## 2016-01-05 DIAGNOSIS — D631 Anemia in chronic kidney disease: Secondary | ICD-10-CM | POA: Diagnosis not present

## 2016-01-09 DIAGNOSIS — D509 Iron deficiency anemia, unspecified: Secondary | ICD-10-CM | POA: Diagnosis not present

## 2016-01-09 DIAGNOSIS — D631 Anemia in chronic kidney disease: Secondary | ICD-10-CM | POA: Diagnosis not present

## 2016-01-09 DIAGNOSIS — N186 End stage renal disease: Secondary | ICD-10-CM | POA: Diagnosis not present

## 2016-01-10 DIAGNOSIS — T8690 Unspecified complication of unspecified transplanted organ and tissue: Secondary | ICD-10-CM | POA: Diagnosis not present

## 2016-01-10 DIAGNOSIS — Z992 Dependence on renal dialysis: Secondary | ICD-10-CM | POA: Diagnosis not present

## 2016-01-10 DIAGNOSIS — N186 End stage renal disease: Secondary | ICD-10-CM | POA: Diagnosis not present

## 2016-01-12 DIAGNOSIS — D509 Iron deficiency anemia, unspecified: Secondary | ICD-10-CM | POA: Diagnosis not present

## 2016-01-12 DIAGNOSIS — N186 End stage renal disease: Secondary | ICD-10-CM | POA: Diagnosis not present

## 2016-01-12 DIAGNOSIS — D689 Coagulation defect, unspecified: Secondary | ICD-10-CM | POA: Diagnosis not present

## 2016-01-14 DIAGNOSIS — D689 Coagulation defect, unspecified: Secondary | ICD-10-CM | POA: Diagnosis not present

## 2016-01-14 DIAGNOSIS — N186 End stage renal disease: Secondary | ICD-10-CM | POA: Diagnosis not present

## 2016-01-14 DIAGNOSIS — D509 Iron deficiency anemia, unspecified: Secondary | ICD-10-CM | POA: Diagnosis not present

## 2016-01-18 DIAGNOSIS — D689 Coagulation defect, unspecified: Secondary | ICD-10-CM | POA: Diagnosis not present

## 2016-01-18 DIAGNOSIS — N186 End stage renal disease: Secondary | ICD-10-CM | POA: Diagnosis not present

## 2016-01-18 DIAGNOSIS — D509 Iron deficiency anemia, unspecified: Secondary | ICD-10-CM | POA: Diagnosis not present

## 2016-01-19 DIAGNOSIS — D509 Iron deficiency anemia, unspecified: Secondary | ICD-10-CM | POA: Diagnosis not present

## 2016-01-19 DIAGNOSIS — D689 Coagulation defect, unspecified: Secondary | ICD-10-CM | POA: Diagnosis not present

## 2016-01-19 DIAGNOSIS — N186 End stage renal disease: Secondary | ICD-10-CM | POA: Diagnosis not present

## 2016-01-23 DIAGNOSIS — N186 End stage renal disease: Secondary | ICD-10-CM | POA: Diagnosis not present

## 2016-01-23 DIAGNOSIS — D689 Coagulation defect, unspecified: Secondary | ICD-10-CM | POA: Diagnosis not present

## 2016-01-23 DIAGNOSIS — D509 Iron deficiency anemia, unspecified: Secondary | ICD-10-CM | POA: Diagnosis not present

## 2016-01-26 DIAGNOSIS — N186 End stage renal disease: Secondary | ICD-10-CM | POA: Diagnosis not present

## 2016-01-26 DIAGNOSIS — D509 Iron deficiency anemia, unspecified: Secondary | ICD-10-CM | POA: Diagnosis not present

## 2016-01-26 DIAGNOSIS — D689 Coagulation defect, unspecified: Secondary | ICD-10-CM | POA: Diagnosis not present

## 2016-01-28 DIAGNOSIS — N186 End stage renal disease: Secondary | ICD-10-CM | POA: Diagnosis not present

## 2016-01-28 DIAGNOSIS — D509 Iron deficiency anemia, unspecified: Secondary | ICD-10-CM | POA: Diagnosis not present

## 2016-01-28 DIAGNOSIS — D689 Coagulation defect, unspecified: Secondary | ICD-10-CM | POA: Diagnosis not present

## 2016-01-30 DIAGNOSIS — N186 End stage renal disease: Secondary | ICD-10-CM | POA: Diagnosis not present

## 2016-01-30 DIAGNOSIS — D509 Iron deficiency anemia, unspecified: Secondary | ICD-10-CM | POA: Diagnosis not present

## 2016-01-30 DIAGNOSIS — D689 Coagulation defect, unspecified: Secondary | ICD-10-CM | POA: Diagnosis not present

## 2016-02-02 DIAGNOSIS — D689 Coagulation defect, unspecified: Secondary | ICD-10-CM | POA: Diagnosis not present

## 2016-02-02 DIAGNOSIS — N186 End stage renal disease: Secondary | ICD-10-CM | POA: Diagnosis not present

## 2016-02-02 DIAGNOSIS — D509 Iron deficiency anemia, unspecified: Secondary | ICD-10-CM | POA: Diagnosis not present

## 2016-02-05 DIAGNOSIS — S0990XA Unspecified injury of head, initial encounter: Secondary | ICD-10-CM | POA: Diagnosis not present

## 2016-02-05 DIAGNOSIS — R202 Paresthesia of skin: Secondary | ICD-10-CM | POA: Diagnosis not present

## 2016-02-05 DIAGNOSIS — F17210 Nicotine dependence, cigarettes, uncomplicated: Secondary | ICD-10-CM | POA: Diagnosis not present

## 2016-02-05 DIAGNOSIS — M25531 Pain in right wrist: Secondary | ICD-10-CM | POA: Diagnosis not present

## 2016-02-05 DIAGNOSIS — S52501A Unspecified fracture of the lower end of right radius, initial encounter for closed fracture: Secondary | ICD-10-CM | POA: Diagnosis not present

## 2016-02-05 DIAGNOSIS — N186 End stage renal disease: Secondary | ICD-10-CM | POA: Diagnosis not present

## 2016-02-05 DIAGNOSIS — S52611A Displaced fracture of right ulna styloid process, initial encounter for closed fracture: Secondary | ICD-10-CM | POA: Diagnosis not present

## 2016-02-05 DIAGNOSIS — Z992 Dependence on renal dialysis: Secondary | ICD-10-CM | POA: Diagnosis not present

## 2016-02-05 DIAGNOSIS — Z940 Kidney transplant status: Secondary | ICD-10-CM | POA: Diagnosis not present

## 2016-02-05 DIAGNOSIS — S52571A Other intraarticular fracture of lower end of right radius, initial encounter for closed fracture: Secondary | ICD-10-CM | POA: Diagnosis not present

## 2016-02-05 DIAGNOSIS — I120 Hypertensive chronic kidney disease with stage 5 chronic kidney disease or end stage renal disease: Secondary | ICD-10-CM | POA: Diagnosis not present

## 2016-02-05 DIAGNOSIS — S59201A Unspecified physeal fracture of lower end of radius, right arm, initial encounter for closed fracture: Secondary | ICD-10-CM | POA: Diagnosis not present

## 2016-02-06 DIAGNOSIS — D509 Iron deficiency anemia, unspecified: Secondary | ICD-10-CM | POA: Diagnosis not present

## 2016-02-06 DIAGNOSIS — N186 End stage renal disease: Secondary | ICD-10-CM | POA: Diagnosis not present

## 2016-02-06 DIAGNOSIS — D689 Coagulation defect, unspecified: Secondary | ICD-10-CM | POA: Diagnosis not present

## 2016-02-09 DIAGNOSIS — N186 End stage renal disease: Secondary | ICD-10-CM | POA: Diagnosis not present

## 2016-02-09 DIAGNOSIS — D509 Iron deficiency anemia, unspecified: Secondary | ICD-10-CM | POA: Diagnosis not present

## 2016-02-09 DIAGNOSIS — D689 Coagulation defect, unspecified: Secondary | ICD-10-CM | POA: Diagnosis not present

## 2016-02-10 DIAGNOSIS — Y93H9 Activity, other involving exterior property and land maintenance, building and construction: Secondary | ICD-10-CM | POA: Diagnosis not present

## 2016-02-10 DIAGNOSIS — S52571A Other intraarticular fracture of lower end of right radius, initial encounter for closed fracture: Secondary | ICD-10-CM | POA: Diagnosis not present

## 2016-02-10 DIAGNOSIS — T8690 Unspecified complication of unspecified transplanted organ and tissue: Secondary | ICD-10-CM | POA: Diagnosis not present

## 2016-02-10 DIAGNOSIS — N186 End stage renal disease: Secondary | ICD-10-CM | POA: Diagnosis not present

## 2016-02-10 DIAGNOSIS — Z992 Dependence on renal dialysis: Secondary | ICD-10-CM | POA: Diagnosis not present

## 2016-02-11 DIAGNOSIS — D689 Coagulation defect, unspecified: Secondary | ICD-10-CM | POA: Diagnosis not present

## 2016-02-11 DIAGNOSIS — N186 End stage renal disease: Secondary | ICD-10-CM | POA: Diagnosis not present

## 2016-02-12 DIAGNOSIS — S52571A Other intraarticular fracture of lower end of right radius, initial encounter for closed fracture: Secondary | ICD-10-CM | POA: Diagnosis not present

## 2016-02-12 DIAGNOSIS — S52511A Displaced fracture of right radial styloid process, initial encounter for closed fracture: Secondary | ICD-10-CM | POA: Diagnosis not present

## 2016-02-12 DIAGNOSIS — S52591A Other fractures of lower end of right radius, initial encounter for closed fracture: Secondary | ICD-10-CM | POA: Diagnosis not present

## 2016-02-13 DIAGNOSIS — N186 End stage renal disease: Secondary | ICD-10-CM | POA: Diagnosis not present

## 2016-02-13 DIAGNOSIS — D689 Coagulation defect, unspecified: Secondary | ICD-10-CM | POA: Diagnosis not present

## 2016-02-16 DIAGNOSIS — N186 End stage renal disease: Secondary | ICD-10-CM | POA: Diagnosis not present

## 2016-02-16 DIAGNOSIS — D689 Coagulation defect, unspecified: Secondary | ICD-10-CM | POA: Diagnosis not present

## 2016-02-17 DIAGNOSIS — S52571D Other intraarticular fracture of lower end of right radius, subsequent encounter for closed fracture with routine healing: Secondary | ICD-10-CM | POA: Diagnosis not present

## 2016-02-18 DIAGNOSIS — N186 End stage renal disease: Secondary | ICD-10-CM | POA: Diagnosis not present

## 2016-02-18 DIAGNOSIS — D689 Coagulation defect, unspecified: Secondary | ICD-10-CM | POA: Diagnosis not present

## 2016-02-20 DIAGNOSIS — D689 Coagulation defect, unspecified: Secondary | ICD-10-CM | POA: Diagnosis not present

## 2016-02-20 DIAGNOSIS — N186 End stage renal disease: Secondary | ICD-10-CM | POA: Diagnosis not present

## 2016-02-23 DIAGNOSIS — D689 Coagulation defect, unspecified: Secondary | ICD-10-CM | POA: Diagnosis not present

## 2016-02-23 DIAGNOSIS — N186 End stage renal disease: Secondary | ICD-10-CM | POA: Diagnosis not present

## 2016-02-24 DIAGNOSIS — S52571D Other intraarticular fracture of lower end of right radius, subsequent encounter for closed fracture with routine healing: Secondary | ICD-10-CM | POA: Diagnosis not present

## 2016-02-24 DIAGNOSIS — S52571A Other intraarticular fracture of lower end of right radius, initial encounter for closed fracture: Secondary | ICD-10-CM | POA: Diagnosis not present

## 2016-02-24 DIAGNOSIS — S52611A Displaced fracture of right ulna styloid process, initial encounter for closed fracture: Secondary | ICD-10-CM | POA: Diagnosis not present

## 2016-02-25 DIAGNOSIS — S52591A Other fractures of lower end of right radius, initial encounter for closed fracture: Secondary | ICD-10-CM | POA: Diagnosis not present

## 2016-02-25 DIAGNOSIS — S52571A Other intraarticular fracture of lower end of right radius, initial encounter for closed fracture: Secondary | ICD-10-CM | POA: Diagnosis not present

## 2016-02-25 DIAGNOSIS — F17210 Nicotine dependence, cigarettes, uncomplicated: Secondary | ICD-10-CM | POA: Diagnosis not present

## 2016-02-25 DIAGNOSIS — I120 Hypertensive chronic kidney disease with stage 5 chronic kidney disease or end stage renal disease: Secondary | ICD-10-CM | POA: Diagnosis not present

## 2016-02-25 DIAGNOSIS — T8612 Kidney transplant failure: Secondary | ICD-10-CM | POA: Diagnosis not present

## 2016-02-25 DIAGNOSIS — Z79899 Other long term (current) drug therapy: Secondary | ICD-10-CM | POA: Diagnosis not present

## 2016-02-25 DIAGNOSIS — N186 End stage renal disease: Secondary | ICD-10-CM | POA: Diagnosis not present

## 2016-02-25 DIAGNOSIS — Z992 Dependence on renal dialysis: Secondary | ICD-10-CM | POA: Diagnosis not present

## 2016-02-25 DIAGNOSIS — S52501G Unspecified fracture of the lower end of right radius, subsequent encounter for closed fracture with delayed healing: Secondary | ICD-10-CM | POA: Diagnosis not present

## 2016-02-26 DIAGNOSIS — N186 End stage renal disease: Secondary | ICD-10-CM | POA: Diagnosis not present

## 2016-02-26 DIAGNOSIS — D689 Coagulation defect, unspecified: Secondary | ICD-10-CM | POA: Diagnosis not present

## 2016-02-27 DIAGNOSIS — D689 Coagulation defect, unspecified: Secondary | ICD-10-CM | POA: Diagnosis not present

## 2016-02-27 DIAGNOSIS — N186 End stage renal disease: Secondary | ICD-10-CM | POA: Diagnosis not present

## 2016-03-01 DIAGNOSIS — D689 Coagulation defect, unspecified: Secondary | ICD-10-CM | POA: Diagnosis not present

## 2016-03-01 DIAGNOSIS — N186 End stage renal disease: Secondary | ICD-10-CM | POA: Diagnosis not present

## 2016-03-03 DIAGNOSIS — D689 Coagulation defect, unspecified: Secondary | ICD-10-CM | POA: Diagnosis not present

## 2016-03-03 DIAGNOSIS — N186 End stage renal disease: Secondary | ICD-10-CM | POA: Diagnosis not present

## 2016-03-08 DIAGNOSIS — D689 Coagulation defect, unspecified: Secondary | ICD-10-CM | POA: Diagnosis not present

## 2016-03-08 DIAGNOSIS — N186 End stage renal disease: Secondary | ICD-10-CM | POA: Diagnosis not present

## 2016-03-09 DIAGNOSIS — S52591D Other fractures of lower end of right radius, subsequent encounter for closed fracture with routine healing: Secondary | ICD-10-CM | POA: Diagnosis not present

## 2016-03-09 DIAGNOSIS — S52571D Other intraarticular fracture of lower end of right radius, subsequent encounter for closed fracture with routine healing: Secondary | ICD-10-CM | POA: Diagnosis not present

## 2016-03-09 DIAGNOSIS — S52611D Displaced fracture of right ulna styloid process, subsequent encounter for closed fracture with routine healing: Secondary | ICD-10-CM | POA: Diagnosis not present

## 2016-03-09 DIAGNOSIS — S52301D Unspecified fracture of shaft of right radius, subsequent encounter for closed fracture with routine healing: Secondary | ICD-10-CM | POA: Diagnosis not present

## 2016-03-10 DIAGNOSIS — N186 End stage renal disease: Secondary | ICD-10-CM | POA: Diagnosis not present

## 2016-03-10 DIAGNOSIS — D689 Coagulation defect, unspecified: Secondary | ICD-10-CM | POA: Diagnosis not present

## 2016-03-11 DIAGNOSIS — Z992 Dependence on renal dialysis: Secondary | ICD-10-CM | POA: Diagnosis not present

## 2016-03-11 DIAGNOSIS — T8690 Unspecified complication of unspecified transplanted organ and tissue: Secondary | ICD-10-CM | POA: Diagnosis not present

## 2016-03-11 DIAGNOSIS — N186 End stage renal disease: Secondary | ICD-10-CM | POA: Diagnosis not present

## 2016-03-12 DIAGNOSIS — D689 Coagulation defect, unspecified: Secondary | ICD-10-CM | POA: Diagnosis not present

## 2016-03-12 DIAGNOSIS — N186 End stage renal disease: Secondary | ICD-10-CM | POA: Diagnosis not present

## 2016-04-06 DIAGNOSIS — S52201D Unspecified fracture of shaft of right ulna, subsequent encounter for closed fracture with routine healing: Secondary | ICD-10-CM | POA: Diagnosis not present

## 2016-04-06 DIAGNOSIS — S52611D Displaced fracture of right ulna styloid process, subsequent encounter for closed fracture with routine healing: Secondary | ICD-10-CM | POA: Diagnosis not present

## 2016-04-06 DIAGNOSIS — S52301D Unspecified fracture of shaft of right radius, subsequent encounter for closed fracture with routine healing: Secondary | ICD-10-CM | POA: Diagnosis not present

## 2016-04-06 DIAGNOSIS — S52571D Other intraarticular fracture of lower end of right radius, subsequent encounter for closed fracture with routine healing: Secondary | ICD-10-CM | POA: Diagnosis not present

## 2016-04-11 DIAGNOSIS — Z992 Dependence on renal dialysis: Secondary | ICD-10-CM | POA: Diagnosis not present

## 2016-04-11 DIAGNOSIS — T8690 Unspecified complication of unspecified transplanted organ and tissue: Secondary | ICD-10-CM | POA: Diagnosis not present

## 2016-04-11 DIAGNOSIS — N186 End stage renal disease: Secondary | ICD-10-CM | POA: Diagnosis not present

## 2016-04-12 DIAGNOSIS — D689 Coagulation defect, unspecified: Secondary | ICD-10-CM | POA: Diagnosis not present

## 2016-04-12 DIAGNOSIS — N186 End stage renal disease: Secondary | ICD-10-CM | POA: Diagnosis not present

## 2016-04-14 DIAGNOSIS — D689 Coagulation defect, unspecified: Secondary | ICD-10-CM | POA: Diagnosis not present

## 2016-04-14 DIAGNOSIS — N186 End stage renal disease: Secondary | ICD-10-CM | POA: Diagnosis not present

## 2016-04-16 DIAGNOSIS — D689 Coagulation defect, unspecified: Secondary | ICD-10-CM | POA: Diagnosis not present

## 2016-04-16 DIAGNOSIS — N186 End stage renal disease: Secondary | ICD-10-CM | POA: Diagnosis not present

## 2016-04-19 DIAGNOSIS — D689 Coagulation defect, unspecified: Secondary | ICD-10-CM | POA: Diagnosis not present

## 2016-04-19 DIAGNOSIS — N186 End stage renal disease: Secondary | ICD-10-CM | POA: Diagnosis not present

## 2016-04-21 DIAGNOSIS — D689 Coagulation defect, unspecified: Secondary | ICD-10-CM | POA: Diagnosis not present

## 2016-04-21 DIAGNOSIS — N186 End stage renal disease: Secondary | ICD-10-CM | POA: Diagnosis not present

## 2016-04-23 DIAGNOSIS — D689 Coagulation defect, unspecified: Secondary | ICD-10-CM | POA: Diagnosis not present

## 2016-04-23 DIAGNOSIS — N186 End stage renal disease: Secondary | ICD-10-CM | POA: Diagnosis not present

## 2016-04-26 DIAGNOSIS — D689 Coagulation defect, unspecified: Secondary | ICD-10-CM | POA: Diagnosis not present

## 2016-04-26 DIAGNOSIS — N186 End stage renal disease: Secondary | ICD-10-CM | POA: Diagnosis not present

## 2016-04-28 DIAGNOSIS — D689 Coagulation defect, unspecified: Secondary | ICD-10-CM | POA: Diagnosis not present

## 2016-04-28 DIAGNOSIS — N186 End stage renal disease: Secondary | ICD-10-CM | POA: Diagnosis not present

## 2016-04-30 DIAGNOSIS — D689 Coagulation defect, unspecified: Secondary | ICD-10-CM | POA: Diagnosis not present

## 2016-04-30 DIAGNOSIS — N186 End stage renal disease: Secondary | ICD-10-CM | POA: Diagnosis not present

## 2016-05-03 DIAGNOSIS — D689 Coagulation defect, unspecified: Secondary | ICD-10-CM | POA: Diagnosis not present

## 2016-05-03 DIAGNOSIS — N186 End stage renal disease: Secondary | ICD-10-CM | POA: Diagnosis not present

## 2016-05-07 DIAGNOSIS — D689 Coagulation defect, unspecified: Secondary | ICD-10-CM | POA: Diagnosis not present

## 2016-05-07 DIAGNOSIS — N186 End stage renal disease: Secondary | ICD-10-CM | POA: Diagnosis not present

## 2016-05-10 DIAGNOSIS — N186 End stage renal disease: Secondary | ICD-10-CM | POA: Diagnosis not present

## 2016-05-10 DIAGNOSIS — D689 Coagulation defect, unspecified: Secondary | ICD-10-CM | POA: Diagnosis not present

## 2016-05-12 DIAGNOSIS — T8690 Unspecified complication of unspecified transplanted organ and tissue: Secondary | ICD-10-CM | POA: Diagnosis not present

## 2016-05-12 DIAGNOSIS — D689 Coagulation defect, unspecified: Secondary | ICD-10-CM | POA: Diagnosis not present

## 2016-05-12 DIAGNOSIS — N186 End stage renal disease: Secondary | ICD-10-CM | POA: Diagnosis not present

## 2016-05-12 DIAGNOSIS — Z992 Dependence on renal dialysis: Secondary | ICD-10-CM | POA: Diagnosis not present

## 2016-05-14 DIAGNOSIS — N186 End stage renal disease: Secondary | ICD-10-CM | POA: Diagnosis not present

## 2016-05-14 DIAGNOSIS — D689 Coagulation defect, unspecified: Secondary | ICD-10-CM | POA: Diagnosis not present

## 2016-05-17 DIAGNOSIS — N186 End stage renal disease: Secondary | ICD-10-CM | POA: Diagnosis not present

## 2016-05-17 DIAGNOSIS — D689 Coagulation defect, unspecified: Secondary | ICD-10-CM | POA: Diagnosis not present

## 2016-05-19 DIAGNOSIS — D689 Coagulation defect, unspecified: Secondary | ICD-10-CM | POA: Diagnosis not present

## 2016-05-19 DIAGNOSIS — N186 End stage renal disease: Secondary | ICD-10-CM | POA: Diagnosis not present

## 2016-05-21 DIAGNOSIS — N186 End stage renal disease: Secondary | ICD-10-CM | POA: Diagnosis not present

## 2016-05-21 DIAGNOSIS — D689 Coagulation defect, unspecified: Secondary | ICD-10-CM | POA: Diagnosis not present

## 2016-05-24 DIAGNOSIS — D689 Coagulation defect, unspecified: Secondary | ICD-10-CM | POA: Diagnosis not present

## 2016-05-24 DIAGNOSIS — N186 End stage renal disease: Secondary | ICD-10-CM | POA: Diagnosis not present

## 2016-05-26 DIAGNOSIS — N186 End stage renal disease: Secondary | ICD-10-CM | POA: Diagnosis not present

## 2016-05-26 DIAGNOSIS — D689 Coagulation defect, unspecified: Secondary | ICD-10-CM | POA: Diagnosis not present

## 2016-05-28 DIAGNOSIS — D689 Coagulation defect, unspecified: Secondary | ICD-10-CM | POA: Diagnosis not present

## 2016-05-28 DIAGNOSIS — N186 End stage renal disease: Secondary | ICD-10-CM | POA: Diagnosis not present

## 2016-05-31 DIAGNOSIS — D689 Coagulation defect, unspecified: Secondary | ICD-10-CM | POA: Diagnosis not present

## 2016-05-31 DIAGNOSIS — N186 End stage renal disease: Secondary | ICD-10-CM | POA: Diagnosis not present

## 2016-06-02 DIAGNOSIS — D689 Coagulation defect, unspecified: Secondary | ICD-10-CM | POA: Diagnosis not present

## 2016-06-02 DIAGNOSIS — N186 End stage renal disease: Secondary | ICD-10-CM | POA: Diagnosis not present

## 2016-06-04 DIAGNOSIS — D689 Coagulation defect, unspecified: Secondary | ICD-10-CM | POA: Diagnosis not present

## 2016-06-04 DIAGNOSIS — N186 End stage renal disease: Secondary | ICD-10-CM | POA: Diagnosis not present

## 2016-06-07 DIAGNOSIS — N186 End stage renal disease: Secondary | ICD-10-CM | POA: Diagnosis not present

## 2016-06-07 DIAGNOSIS — D689 Coagulation defect, unspecified: Secondary | ICD-10-CM | POA: Diagnosis not present

## 2016-06-09 DIAGNOSIS — N186 End stage renal disease: Secondary | ICD-10-CM | POA: Diagnosis not present

## 2016-06-09 DIAGNOSIS — D689 Coagulation defect, unspecified: Secondary | ICD-10-CM | POA: Diagnosis not present

## 2016-06-11 DIAGNOSIS — T8690 Unspecified complication of unspecified transplanted organ and tissue: Secondary | ICD-10-CM | POA: Diagnosis not present

## 2016-06-11 DIAGNOSIS — Z992 Dependence on renal dialysis: Secondary | ICD-10-CM | POA: Diagnosis not present

## 2016-06-11 DIAGNOSIS — N186 End stage renal disease: Secondary | ICD-10-CM | POA: Diagnosis not present

## 2016-06-14 DIAGNOSIS — D689 Coagulation defect, unspecified: Secondary | ICD-10-CM | POA: Diagnosis not present

## 2016-06-14 DIAGNOSIS — N186 End stage renal disease: Secondary | ICD-10-CM | POA: Diagnosis not present

## 2016-06-14 DIAGNOSIS — Z23 Encounter for immunization: Secondary | ICD-10-CM | POA: Diagnosis not present

## 2016-06-16 DIAGNOSIS — Z23 Encounter for immunization: Secondary | ICD-10-CM | POA: Diagnosis not present

## 2016-06-16 DIAGNOSIS — D689 Coagulation defect, unspecified: Secondary | ICD-10-CM | POA: Diagnosis not present

## 2016-06-16 DIAGNOSIS — N186 End stage renal disease: Secondary | ICD-10-CM | POA: Diagnosis not present

## 2016-06-18 DIAGNOSIS — N186 End stage renal disease: Secondary | ICD-10-CM | POA: Diagnosis not present

## 2016-06-18 DIAGNOSIS — D689 Coagulation defect, unspecified: Secondary | ICD-10-CM | POA: Diagnosis not present

## 2016-06-18 DIAGNOSIS — Z23 Encounter for immunization: Secondary | ICD-10-CM | POA: Diagnosis not present

## 2016-06-21 DIAGNOSIS — N186 End stage renal disease: Secondary | ICD-10-CM | POA: Diagnosis not present

## 2016-06-21 DIAGNOSIS — Z23 Encounter for immunization: Secondary | ICD-10-CM | POA: Diagnosis not present

## 2016-06-21 DIAGNOSIS — D689 Coagulation defect, unspecified: Secondary | ICD-10-CM | POA: Diagnosis not present

## 2016-06-23 DIAGNOSIS — D689 Coagulation defect, unspecified: Secondary | ICD-10-CM | POA: Diagnosis not present

## 2016-06-23 DIAGNOSIS — N186 End stage renal disease: Secondary | ICD-10-CM | POA: Diagnosis not present

## 2016-06-23 DIAGNOSIS — Z23 Encounter for immunization: Secondary | ICD-10-CM | POA: Diagnosis not present

## 2016-06-25 DIAGNOSIS — Z23 Encounter for immunization: Secondary | ICD-10-CM | POA: Diagnosis not present

## 2016-06-25 DIAGNOSIS — D689 Coagulation defect, unspecified: Secondary | ICD-10-CM | POA: Diagnosis not present

## 2016-06-25 DIAGNOSIS — N186 End stage renal disease: Secondary | ICD-10-CM | POA: Diagnosis not present

## 2016-06-28 DIAGNOSIS — Z23 Encounter for immunization: Secondary | ICD-10-CM | POA: Diagnosis not present

## 2016-06-28 DIAGNOSIS — D689 Coagulation defect, unspecified: Secondary | ICD-10-CM | POA: Diagnosis not present

## 2016-06-28 DIAGNOSIS — N186 End stage renal disease: Secondary | ICD-10-CM | POA: Diagnosis not present

## 2016-06-30 DIAGNOSIS — Z23 Encounter for immunization: Secondary | ICD-10-CM | POA: Diagnosis not present

## 2016-06-30 DIAGNOSIS — N186 End stage renal disease: Secondary | ICD-10-CM | POA: Diagnosis not present

## 2016-06-30 DIAGNOSIS — D689 Coagulation defect, unspecified: Secondary | ICD-10-CM | POA: Diagnosis not present

## 2016-07-02 DIAGNOSIS — D689 Coagulation defect, unspecified: Secondary | ICD-10-CM | POA: Diagnosis not present

## 2016-07-02 DIAGNOSIS — N186 End stage renal disease: Secondary | ICD-10-CM | POA: Diagnosis not present

## 2016-07-02 DIAGNOSIS — Z23 Encounter for immunization: Secondary | ICD-10-CM | POA: Diagnosis not present

## 2016-07-05 DIAGNOSIS — N186 End stage renal disease: Secondary | ICD-10-CM | POA: Diagnosis not present

## 2016-07-05 DIAGNOSIS — Z23 Encounter for immunization: Secondary | ICD-10-CM | POA: Diagnosis not present

## 2016-07-05 DIAGNOSIS — D689 Coagulation defect, unspecified: Secondary | ICD-10-CM | POA: Diagnosis not present

## 2016-07-08 DIAGNOSIS — D689 Coagulation defect, unspecified: Secondary | ICD-10-CM | POA: Diagnosis not present

## 2016-07-08 DIAGNOSIS — Z23 Encounter for immunization: Secondary | ICD-10-CM | POA: Diagnosis not present

## 2016-07-08 DIAGNOSIS — N186 End stage renal disease: Secondary | ICD-10-CM | POA: Diagnosis not present

## 2016-07-09 DIAGNOSIS — D689 Coagulation defect, unspecified: Secondary | ICD-10-CM | POA: Diagnosis not present

## 2016-07-09 DIAGNOSIS — Z23 Encounter for immunization: Secondary | ICD-10-CM | POA: Diagnosis not present

## 2016-07-09 DIAGNOSIS — N186 End stage renal disease: Secondary | ICD-10-CM | POA: Diagnosis not present

## 2016-07-12 DIAGNOSIS — T8690 Unspecified complication of unspecified transplanted organ and tissue: Secondary | ICD-10-CM | POA: Diagnosis not present

## 2016-07-12 DIAGNOSIS — N186 End stage renal disease: Secondary | ICD-10-CM | POA: Diagnosis not present

## 2016-07-12 DIAGNOSIS — Z23 Encounter for immunization: Secondary | ICD-10-CM | POA: Diagnosis not present

## 2016-07-12 DIAGNOSIS — D689 Coagulation defect, unspecified: Secondary | ICD-10-CM | POA: Diagnosis not present

## 2016-07-12 DIAGNOSIS — Z992 Dependence on renal dialysis: Secondary | ICD-10-CM | POA: Diagnosis not present

## 2016-07-15 DIAGNOSIS — D689 Coagulation defect, unspecified: Secondary | ICD-10-CM | POA: Diagnosis not present

## 2016-07-15 DIAGNOSIS — N186 End stage renal disease: Secondary | ICD-10-CM | POA: Diagnosis not present

## 2016-07-19 DIAGNOSIS — D689 Coagulation defect, unspecified: Secondary | ICD-10-CM | POA: Diagnosis not present

## 2016-07-19 DIAGNOSIS — N186 End stage renal disease: Secondary | ICD-10-CM | POA: Diagnosis not present

## 2016-07-21 DIAGNOSIS — N186 End stage renal disease: Secondary | ICD-10-CM | POA: Diagnosis not present

## 2016-07-21 DIAGNOSIS — D689 Coagulation defect, unspecified: Secondary | ICD-10-CM | POA: Diagnosis not present

## 2016-07-23 DIAGNOSIS — D689 Coagulation defect, unspecified: Secondary | ICD-10-CM | POA: Diagnosis not present

## 2016-07-23 DIAGNOSIS — N186 End stage renal disease: Secondary | ICD-10-CM | POA: Diagnosis not present

## 2016-07-26 DIAGNOSIS — N186 End stage renal disease: Secondary | ICD-10-CM | POA: Diagnosis not present

## 2016-07-26 DIAGNOSIS — D689 Coagulation defect, unspecified: Secondary | ICD-10-CM | POA: Diagnosis not present

## 2016-07-28 DIAGNOSIS — D689 Coagulation defect, unspecified: Secondary | ICD-10-CM | POA: Diagnosis not present

## 2016-07-28 DIAGNOSIS — N186 End stage renal disease: Secondary | ICD-10-CM | POA: Diagnosis not present

## 2016-07-30 DIAGNOSIS — N186 End stage renal disease: Secondary | ICD-10-CM | POA: Diagnosis not present

## 2016-07-30 DIAGNOSIS — D689 Coagulation defect, unspecified: Secondary | ICD-10-CM | POA: Diagnosis not present

## 2016-08-03 ENCOUNTER — Encounter: Payer: Self-pay | Admitting: Surgery

## 2016-08-03 DIAGNOSIS — N186 End stage renal disease: Secondary | ICD-10-CM | POA: Diagnosis not present

## 2016-08-03 DIAGNOSIS — D689 Coagulation defect, unspecified: Secondary | ICD-10-CM | POA: Diagnosis not present

## 2016-08-04 DIAGNOSIS — D689 Coagulation defect, unspecified: Secondary | ICD-10-CM | POA: Diagnosis not present

## 2016-08-04 DIAGNOSIS — N186 End stage renal disease: Secondary | ICD-10-CM | POA: Diagnosis not present

## 2016-08-06 DIAGNOSIS — D689 Coagulation defect, unspecified: Secondary | ICD-10-CM | POA: Diagnosis not present

## 2016-08-06 DIAGNOSIS — N186 End stage renal disease: Secondary | ICD-10-CM | POA: Diagnosis not present

## 2016-08-08 ENCOUNTER — Ambulatory Visit (INDEPENDENT_AMBULATORY_CARE_PROVIDER_SITE_OTHER): Payer: Medicare Other | Admitting: Surgery

## 2016-08-08 ENCOUNTER — Encounter: Payer: Self-pay | Admitting: Surgery

## 2016-08-08 VITALS — BP 162/98 | HR 85 | Temp 97.1°F | Resp 18 | Ht 74.0 in | Wt 159.0 lb

## 2016-08-08 DIAGNOSIS — Z992 Dependence on renal dialysis: Secondary | ICD-10-CM | POA: Diagnosis not present

## 2016-08-08 DIAGNOSIS — N186 End stage renal disease: Secondary | ICD-10-CM | POA: Diagnosis not present

## 2016-08-08 NOTE — Progress Notes (Signed)
[facility] [place]  Patient name: Curtis Rangel MRN: [id number] DOB: 03/25/1976 Sex: male  REASON FOR VISIT: ESRD  HPI: Curtis Rangel is a 40 y.o. male who comes in today for evaluation of his fistula.  He states that he is having some difficulty at the end of his dialysis runs.  He was sent over for evaluation of a red area on the superior aspect of his fistula near the shoulder, however he states this looks much better.  He states that the fistula is not increasing in size.  He does not have any swelling in the arm.  The patient underwent a left basilic vein transposition by Dr. Oneida Alar in 2014.  He then underwent angioplasty of a high-grade lesion by Dr. early in 2015 using a 16 x 4 Atlas balloon.  He has not had any further evaluations of his fistula  Past Medical History:  Diagnosis Date  . Anemia   . Asthma   . 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 CAD (coronary artery disease)   . Dialysis patient ([place])   . Hypertension   . Kidney failure     Family History  Problem Relation Age of Onset  . Diabetes Mother   . Varicose Veins Mother   . Heart disease Father     SOCIAL HISTORY: Social History  Substance Use Topics  . Smoking status: Current Every Day Smoker    Packs/day: 0.50  . Smokeless tobacco: Never Used  . Alcohol use No    No Known Allergies  Current Outpatient Prescriptions  Medication Sig Dispense Refill  . amLODipine (NORVASC) 10 MG tablet Take 10 mg by mouth daily.    . calcium acetate (PHOSLO) 667 MG capsule Take 667-2,001 mg by mouth 4 (four) times daily. Takes 3 capsules with every meal and takes 1 capsule with each snack.    . clonazePAM (KLONOPIN) 1 MG tablet Take 1 mg by mouth daily as needed for anxiety.    . fosinopril (MONOPRIL) 40 MG tablet Take 40 mg by mouth at bedtime.    Marland Kitchen ibuprofen (ADVIL,MOTRIN) 200 MG tablet Take 400 mg by mouth daily as needed for headache.      . labetalol (NORMODYNE) 200 MG tablet Take 200 mg by mouth 2 (two) times daily.    Marland Kitchen lisinopril (PRINIVIL,ZESTRIL) 20 MG tablet Take 20 mg by mouth daily.    . minoxidil (LONITEN) 2.5 MG tablet Take 5 mg by mouth at bedtime.    . mometasone (NASONEX) 50 MCG/ACT nasal spray Place 2 sprays into the nose daily as needed (Allergies).     No current facility-administered medications for this visit.     REVIEW OF SYSTEMS:  [X]  denotes positive finding, [ ]  denotes negative finding Cardiac  Comments:  Chest pain or chest pressure:    Shortness of breath upon exertion:    Short of breath when lying flat:    Irregular heart rhythm:        Vascular    Pain in calf, thigh, or hip brought on by ambulation:    Pain in feet at night that wakes you up from your sleep:     Blood clot in your veins:    Leg swelling:         Pulmonary    Oxygen at home:    Productive cough:     Wheezing:         Neurologic    Sudden weakness  in arms or legs:     Sudden numbness in arms or legs:     Sudden onset of difficulty speaking or slurred speech:    Temporary loss of vision in one eye:     Problems with dizziness:         Gastrointestinal    Blood in stool:     Vomited blood:         Genitourinary    Burning when urinating:     Blood in urine:        Psychiatric    Major depression:         Hematologic    Bleeding problems:    Problems with blood clotting too easily:        Skin    Rashes or ulcers:        Constitutional    Fever or chills:      PHYSICAL EXAM: Vitals:   08/08/16 1411 08/08/16 1414  BP: (!) 157/99 (!) 162/98  Pulse: 85   Resp: 18   Temp: 97.1 F (36.2 C)   TempSrc: Oral   SpO2: 99%   Weight: 159 lb (72.1 kg)   Height: 6\' 2"  (1.88 m)     GENERAL: The patient is a well-nourished male, in no acute distress. The vital signs are documented above. CARDIAC: There is a regular rate and rhythm.  VASCULAR: Ectatic basilic vein fistula in the upper arm.  Pinpoint  area near the shoulder is open but according to the patient is getting much better. PULMONARY: There is good air exchange bilaterally without wheezing or rales. MUSCULOSKELETAL: There are no major deformities or cyanosis. NEUROLOGIC: No focal weakness or paresthesias are detected. SKIN: There are no ulcers or rashes noted. PSYCHIATRIC: The patient has a normal affect.  DATA:  None  MEDICAL ISSUES: Difficulty with dialysis: I discussed proceeding with a left arm fistulogram and intervention if indicated.  He dialyzes on Tuesday Thursday Saturday and so I will arrange this on a Monday Wednesday Friday.  He has previously had an area near the shoulder dilated by Dr. early using a 16 mm balloon.    Annamarie Major, MD [facility] [phone] Pager [phone]

## 2016-08-09 ENCOUNTER — Other Ambulatory Visit: Payer: Self-pay | Admitting: *Deleted

## 2016-08-09 DIAGNOSIS — D689 Coagulation defect, unspecified: Secondary | ICD-10-CM | POA: Diagnosis not present

## 2016-08-09 DIAGNOSIS — N186 End stage renal disease: Secondary | ICD-10-CM | POA: Diagnosis not present

## 2016-08-11 DIAGNOSIS — T8690 Unspecified complication of unspecified transplanted organ and tissue: Secondary | ICD-10-CM | POA: Diagnosis not present

## 2016-08-11 DIAGNOSIS — Z992 Dependence on renal dialysis: Secondary | ICD-10-CM | POA: Diagnosis not present

## 2016-08-11 DIAGNOSIS — N186 End stage renal disease: Secondary | ICD-10-CM | POA: Diagnosis not present

## 2016-08-11 DIAGNOSIS — D689 Coagulation defect, unspecified: Secondary | ICD-10-CM | POA: Diagnosis not present

## 2016-08-13 DIAGNOSIS — D509 Iron deficiency anemia, unspecified: Secondary | ICD-10-CM | POA: Diagnosis not present

## 2016-08-13 DIAGNOSIS — D689 Coagulation defect, unspecified: Secondary | ICD-10-CM | POA: Diagnosis not present

## 2016-08-13 DIAGNOSIS — N186 End stage renal disease: Secondary | ICD-10-CM | POA: Diagnosis not present

## 2016-08-15 ENCOUNTER — Encounter (HOSPITAL_COMMUNITY): Payer: Self-pay | Admitting: Vascular Surgery

## 2016-08-15 ENCOUNTER — Ambulatory Visit (HOSPITAL_COMMUNITY)
Admission: RE | Admit: 2016-08-15 | Discharge: 2016-08-15 | Disposition: A | Discharge status: Home / Self Care | Payer: Medicare Other | Source: Ambulatory Visit | Attending: Surgery | Admitting: Surgery

## 2016-08-15 ENCOUNTER — Encounter (HOSPITAL_COMMUNITY)
Admission: RE | Disposition: A | Discharge status: Home / Self Care | Payer: Self-pay | Source: Ambulatory Visit | Attending: Surgery

## 2016-08-15 DIAGNOSIS — N186 End stage renal disease: Secondary | ICD-10-CM | POA: Diagnosis not present

## 2016-08-15 DIAGNOSIS — I2510 Atherosclerotic heart disease of native coronary artery without angina pectoris: Secondary | ICD-10-CM | POA: Diagnosis not present

## 2016-08-15 DIAGNOSIS — F17210 Nicotine dependence, cigarettes, uncomplicated: Secondary | ICD-10-CM | POA: Insufficient documentation

## 2016-08-15 DIAGNOSIS — Y832 Surgical operation with anastomosis, bypass or graft as the cause of abnormal reaction of the patient, or of later complication, without mention of misadventure at the time of the procedure: Secondary | ICD-10-CM | POA: Insufficient documentation

## 2016-08-15 DIAGNOSIS — Z992 Dependence on renal dialysis: Secondary | ICD-10-CM | POA: Diagnosis not present

## 2016-08-15 DIAGNOSIS — I120 Hypertensive chronic kidney disease with stage 5 chronic kidney disease or end stage renal disease: Secondary | ICD-10-CM | POA: Diagnosis not present

## 2016-08-15 DIAGNOSIS — T82898A Other specified complication of vascular prosthetic devices, implants and grafts, initial encounter: Secondary | ICD-10-CM | POA: Diagnosis not present

## 2016-08-15 DIAGNOSIS — J45909 Unspecified asthma, uncomplicated: Secondary | ICD-10-CM | POA: Insufficient documentation

## 2016-08-15 DIAGNOSIS — H5461 Unqualified visual loss, right eye, normal vision left eye: Secondary | ICD-10-CM | POA: Diagnosis not present

## 2016-08-15 DIAGNOSIS — T82858A Stenosis of vascular prosthetic devices, implants and grafts, initial encounter: Secondary | ICD-10-CM | POA: Diagnosis not present

## 2016-08-15 HISTORY — PX: PERIPHERAL VASCULAR CATHETERIZATION: SHX172C

## 2016-08-15 LAB — BASIC METABOLIC PANEL
ANION GAP: 18 — AB (ref 5–15)
BUN: 45 mg/dL — AB (ref 6–20)
CALCIUM: 8.9 mg/dL (ref 8.9–10.3)
CO2: 20 mmol/L — ABNORMAL LOW (ref 22–32)
Chloride: 94 mmol/L — ABNORMAL LOW (ref 101–111)
Creatinine, Ser: 11.96 mg/dL — ABNORMAL HIGH (ref 0.61–1.24)
GFR calc Af Amer: 5 mL/min — ABNORMAL LOW (ref >60)
GFR, EST NON AFRICAN AMERICAN: 5 mL/min — AB (ref >60)
Glucose, Bld: 76 mg/dL (ref 65–99)
POTASSIUM: 5.3 mmol/L — AB (ref 3.5–5.1)
SODIUM: 132 mmol/L — AB (ref 135–145)

## 2016-08-15 SURGERY — A/V FISTULAGRAM

## 2016-08-15 MED ORDER — LIDOCAINE HCL (PF) 1 % IJ SOLN
INTRAMUSCULAR | Status: AC
  Filled 2016-08-15: qty 30

## 2016-08-15 MED ORDER — HEPARIN SODIUM (PORCINE) 1000 UNIT/ML IJ SOLN
INTRAMUSCULAR | Status: DC | PRN
  Administered 2016-08-15: 4000 [IU] via INTRAVENOUS

## 2016-08-15 MED ORDER — HEPARIN (PORCINE) IN NACL 2-0.9 UNIT/ML-% IJ SOLN
INTRAMUSCULAR | Status: AC
  Filled 2016-08-15: qty 500

## 2016-08-15 MED ORDER — LIDOCAINE HCL (PF) 1 % IJ SOLN
INTRAMUSCULAR | Status: DC | PRN
  Administered 2016-08-15: 4 mL

## 2016-08-15 MED ORDER — MIDAZOLAM HCL 2 MG/2ML IJ SOLN
INTRAMUSCULAR | Status: AC
  Filled 2016-08-15: qty 2

## 2016-08-15 MED ORDER — SODIUM CHLORIDE 0.9% FLUSH: 3.0000 mL | Freq: Two times a day (BID) | INTRAVENOUS | Status: DC

## 2016-08-15 MED ORDER — SODIUM CHLORIDE 0.9 % IV SOLN: 250.0000 mL | INTRAVENOUS | Status: DC | PRN

## 2016-08-15 MED ORDER — MIDAZOLAM HCL 2 MG/2ML IJ SOLN
INTRAMUSCULAR | Status: DC | PRN
  Administered 2016-08-15: 1 mg via INTRAVENOUS

## 2016-08-15 MED ORDER — SODIUM CHLORIDE 0.9% FLUSH: 3.0000 mL | INTRAVENOUS | Status: DC | PRN

## 2016-08-15 MED ORDER — FENTANYL CITRATE (PF) 100 MCG/2ML IJ SOLN
INTRAMUSCULAR | Status: AC
  Filled 2016-08-15: qty 2

## 2016-08-15 MED ORDER — IODIXANOL 320 MG/ML IV SOLN
INTRAVENOUS | Status: DC | PRN
  Administered 2016-08-15: 70 mL

## 2016-08-15 MED ORDER — FENTANYL CITRATE (PF) 100 MCG/2ML IJ SOLN
INTRAMUSCULAR | Status: DC | PRN
  Administered 2016-08-15: 50 ug via INTRAVENOUS

## 2016-08-15 MED ORDER — HEPARIN (PORCINE) IN NACL 2-0.9 UNIT/ML-% IJ SOLN
INTRAMUSCULAR | Status: DC | PRN
  Administered 2016-08-15: 500 mL

## 2016-08-15 SURGICAL SUPPLY — 18 items
BAG SNAP BAND KOVER 36X36 (MISCELLANEOUS) ×4 IMPLANT
BALLN ATLAS 12X40X75 (BALLOONS) ×4
BALLN ATLAS 14X40X75 (BALLOONS) ×4
BALLOON ATLAS 12X40X75 (BALLOONS) IMPLANT
BALLOON ATLAS 14X40X75 (BALLOONS) IMPLANT
COVER DOME SNAP 22 D (MISCELLANEOUS) ×4 IMPLANT
COVER PRB 48X5XTLSCP FOLD TPE (BAG) ×2 IMPLANT
COVER PROBE 5X48 (BAG) ×4
KIT ENCORE 26 ADVANTAGE (KITS) ×2 IMPLANT
KIT MICROINTRODUCER STIFF 5F (SHEATH) ×2 IMPLANT
PROTECTION STATION PRESSURIZED (MISCELLANEOUS) ×4
SHEATH PINNACLE R/O II 7F 4CM (SHEATH) ×4 IMPLANT
STATION PROTECTION PRESSURIZED (MISCELLANEOUS) ×2 IMPLANT
STOPCOCK MORSE 400PSI 3WAY (MISCELLANEOUS) ×4 IMPLANT
TRAY PV CATH (CUSTOM PROCEDURE TRAY) ×4 IMPLANT
TUBING CIL FLEX 10 FLL-RA (TUBING) ×4 IMPLANT
WIRE BENTSON .035X145CM (WIRE) ×2 IMPLANT
WIRE TORQFLEX AUST .018X40CM (WIRE) ×2 IMPLANT

## 2016-08-15 NOTE — Interval H&P Note (Signed)
History and Physical Interval Note:  08/15/2016 8:57 AM  Curtis Rangel  has presented today for surgery, with the diagnosis of low flow fistula  The various methods of treatment have been discussed with the patient and family. After consideration of risks, benefits and other options for treatment, the patient has consented to  Procedure(s): A/V Fistulagram (N/A) as a surgical intervention .  The patient's history has been reviewed, patient examined, no change in status, stable for surgery.  I have reviewed the patient's chart and labs.  Questions were answered to the patient's satisfaction.     Deitra Mayo

## 2016-08-15 NOTE — Op Note (Signed)
   PATIENT: Curtis Rangel   MRN: [id number] DOB: 1976/09/03    DATE OF PROCEDURE: 08/15/2016  INDICATIONS: Curtis Rangel is a 40 y.o. male who presents with a poorly functioning left basilic vein transposition. This was placed approximately 9 years ago according to the patient. In 2015 he did undergo venoplasty at the level of the shoulder. He had an excellent result from this.  PROCEDURE:  1. Ultrasound-guided access to the left basilic vein transposition 2. Fistulogram left basilic vein transposition 3. Venoplasty of left basilic vein  12 mm x 4 cm at was balloon inflated to 12 atm for 1 minute 2  14 mm x 4 cm Atlas balloon inflated to 18 atm for 1 minute 2  SURGEON: Judeth Cornfield. Scot Dock, MD, FACS  ANESTHESIA: Local with sedation   EBL: Minimal  TECHNIQUE: The patient was taken to the peripheral vascular lab and sedated.The period of conscious sedation was 40 minutes.  During that time period, I was present face-to-face 100% of the time.  The patient was administered (1 mg of Versed and 50 g of fentanyl.). The patient's heart rate, blood pressure, and oxygen saturation were monitored by the nurse continuously during the procedure.  The patient was taken to the peripheral vascular lab and the left arm was prepped and draped in usual sterile fashion. Under ultrasound guidance, after the skin was anesthetized, the fistula was cannulated above the antecubital level with a micropuncture needle and a micropuncture sheath introduced over a wire. Fistulogram wasn't obtained to evaluate the fistula from the site of cannulation to include the central veins.  There was an irregular area in the upper arm over a length of approximate 4-5 cm. In addition, there was a small pseudoaneurysm just central to the area of the narrowing. The fistula was somewhat pulsatile proximal to this. I elected to address this with venoplasty. The patient received 4000 units of IV heparin. A Bentson wire was  advanced through the micropuncture sheath and this was exchanged for a short 7 [place] sheath. I initially selected a 12 mm x 4 cm Atlas balloon. This was not quite long enough to encompass the entire area of concern. Therefore 2 inflations were made to encompass the entire area. The balloon was inflated to 12 atm for 1 minute in both locations.  With the balloon inflated a retrograde shot was obtained to evaluate the proximal fistula and arterial anastomosis and this was widely patent. There was some residual stenosis and therefore I went back with a 14 mm x 4 cm Atlas balloon. Again 2 inflations were made to encompass the entire area. The balloon was inflated to 18 atm for 1 minute twice. Completion films showed significant improvement in the stenosis and also there was a significant improvement in the thrill in the AV fistula.  A 4-0 Monocryl was used to close the cannulation site after the balloon and wire were removed. Pressure was held for hemostasis. No immediate complications were noted.  FINDINGS:  1. Widely patent left basilic vein transposition. No problems identified at the arterial end of the fistula. 2. There was an area of narrowing and irregularity over a length of 4-5 cm in the upper arm that was sedated excessively ballooned as described above with minimal residual stenosis.  Deitra Mayo, MD, FACS [facility]  DATE OF DICTATION:   08/15/2016

## 2016-08-15 NOTE — H&P (View-Only) (Signed)
Vascular and Vein Specialist of [place]  Patient name: Curtis Rangel MRN: [id number] DOB: 04/15/1976 Sex: male  REASON FOR VISIT: ESRD  HPI: Curtis Rangel is a 40 y.o. male who comes in today for evaluation of his fistula.  He states that he is having some difficulty at the end of his dialysis runs.  He was sent over for evaluation of a red area on the superior aspect of his fistula near the shoulder, however he states this looks much better.  He states that the fistula is not increasing in size.  He does not have any swelling in the arm.  The patient underwent a left basilic vein transposition by Dr. Oneida Rangel in 2014.  He then underwent angioplasty of a high-grade lesion by Dr. early in 2015 using a 16 x 4 Atlas balloon.  He has not had any further evaluations of his fistula  Past Medical History:  Diagnosis Date  . Anemia   . Asthma   . 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 CAD (coronary artery disease)   . Dialysis patient ([place])   . Hypertension   . Kidney failure     Family History  Problem Relation Age of Onset  . Diabetes Mother   . Varicose Veins Mother   . Heart disease Father     SOCIAL HISTORY: Social History  Substance Use Topics  . Smoking status: Current Every Day Smoker    Packs/day: 0.50  . Smokeless tobacco: Never Used  . Alcohol use No    No Known Allergies  Current Outpatient Prescriptions  Medication Sig Dispense Refill  . amLODipine (NORVASC) 10 MG tablet Take 10 mg by mouth daily.    . calcium acetate (PHOSLO) 667 MG capsule Take 667-2,001 mg by mouth 4 (four) times daily. Takes 3 capsules with every meal and takes 1 capsule with each snack.    . clonazePAM (KLONOPIN) 1 MG tablet Take 1 mg by mouth daily as needed for anxiety.    . fosinopril (MONOPRIL) 40 MG tablet Take 40 mg by mouth at bedtime.    Marland Kitchen ibuprofen (ADVIL,MOTRIN) 200 MG tablet Take 400 mg by mouth daily as needed for headache.      . labetalol (NORMODYNE) 200 MG tablet Take 200 mg by mouth 2 (two) times daily.    Marland Kitchen lisinopril (PRINIVIL,ZESTRIL) 20 MG tablet Take 20 mg by mouth daily.    . minoxidil (LONITEN) 2.5 MG tablet Take 5 mg by mouth at bedtime.    . mometasone (NASONEX) 50 MCG/ACT nasal spray Place 2 sprays into the nose daily as needed (Allergies).     No current facility-administered medications for this visit.     REVIEW OF SYSTEMS:  [X]  denotes positive finding, [ ]  denotes negative finding Cardiac  Comments:  Chest pain or chest pressure:    Shortness of breath upon exertion:    Short of breath when lying flat:    Irregular heart rhythm:        Vascular    Pain in calf, thigh, or hip brought on by ambulation:    Pain in feet at night that wakes you up from your sleep:     Blood clot in your veins:    Leg swelling:         Pulmonary    Oxygen at home:    Productive cough:     Wheezing:         Neurologic    Sudden weakness  in arms or legs:     Sudden numbness in arms or legs:     Sudden onset of difficulty speaking or slurred speech:    Temporary loss of vision in one eye:     Problems with dizziness:         Gastrointestinal    Blood in stool:     Vomited blood:         Genitourinary    Burning when urinating:     Blood in urine:        Psychiatric    Major depression:         Hematologic    Bleeding problems:    Problems with blood clotting too easily:        Skin    Rashes or ulcers:        Constitutional    Fever or chills:      PHYSICAL EXAM: Vitals:   08/08/16 1411 08/08/16 1414  BP: (!) 157/99 (!) 162/98  Pulse: 85   Resp: 18   Temp: 97.1 F (36.2 C)   TempSrc: Oral   SpO2: 99%   Weight: 159 lb (72.1 kg)   Height: 6\' 2"  (1.88 m)     GENERAL: The patient is a well-nourished male, in no acute distress. The vital signs are documented above. CARDIAC: There is a regular rate and rhythm.  VASCULAR: Ectatic basilic vein fistula in the upper arm.  Pinpoint  area near the shoulder is open but according to the patient is getting much better. PULMONARY: There is good air exchange bilaterally without wheezing or rales. MUSCULOSKELETAL: There are no major deformities or cyanosis. NEUROLOGIC: No focal weakness or paresthesias are detected. SKIN: There are no ulcers or rashes noted. PSYCHIATRIC: The patient has a normal affect.  DATA:  None  MEDICAL ISSUES: Difficulty with dialysis: I discussed proceeding with a left arm fistulogram and intervention if indicated.  He dialyzes on Tuesday Thursday Saturday and so I will arrange this on a Monday Wednesday Friday.  He has previously had an area near the shoulder dilated by Dr. early using a 16 mm balloon.    Annamarie Major, MD [facility] Special[facility] of [facility] [phone] Pager [phone]

## 2016-08-15 NOTE — Discharge Instructions (Signed)
Fistulogram, Care After °Introduction °Refer to this sheet in the next few weeks. These instructions provide you with information on caring for yourself after your procedure. Your health care provider may also give you more specific instructions. Your treatment has been planned according to current medical practices, but problems sometimes occur. Call your health care provider if you have any problems or questions after your procedure. °What can I expect after the procedure? °After your procedure, it is typical to have the following: °· A small amount of discomfort in the area where the catheters were placed. °· A small amount of bruising around the fistula. °· Sleepiness and fatigue. °Follow these instructions at home: °· Rest at home for the day following your procedure. °· Do not drive or operate heavy machinery while taking pain medicine. °· Take medicines only as directed by your health care provider. °· Do not take baths, swim, or use a hot tub until your health care provider approves. You may shower 24 hours after the procedure or as directed by your health care provider. °· There are many different ways to close and cover an incision, including stitches, skin glue, and adhesive strips. Follow your health care provider's instructions on: °¨ Incision care. °¨ Bandage (dressing) changes and removal. °¨ Incision closure removal. °· Monitor your dialysis fistula carefully. °Contact a health care provider if: °· You have drainage, redness, swelling, or pain at your catheter site. °· You have a fever. °· You have chills. °Get help right away if: °· You feel weak. °· You have trouble balancing. °· You have trouble moving your arms or legs. °· You have problems with your speech or vision. °· You can no longer feel a vibration or buzz when you put your fingers over your dialysis fistula. °· The limb that was used for the procedure: °¨ Swells. °¨ Is painful. °¨ Is cold. °¨ Is discolored, such as blue or pale  white. °This information is not intended to replace advice given to you by your health care provider. Make sure you discuss any questions you have with your health care provider. °Document Released: 01/13/2014 Document Revised: 02/04/2016 Document Reviewed: 10/18/2013 °© 2017 [employer]vier ° °

## 2016-08-16 DIAGNOSIS — D689 Coagulation defect, unspecified: Secondary | ICD-10-CM | POA: Diagnosis not present

## 2016-08-16 DIAGNOSIS — D509 Iron deficiency anemia, unspecified: Secondary | ICD-10-CM | POA: Diagnosis not present

## 2016-08-16 DIAGNOSIS — N186 End stage renal disease: Secondary | ICD-10-CM | POA: Diagnosis not present

## 2016-08-18 DIAGNOSIS — D509 Iron deficiency anemia, unspecified: Secondary | ICD-10-CM | POA: Diagnosis not present

## 2016-08-18 DIAGNOSIS — N186 End stage renal disease: Secondary | ICD-10-CM | POA: Diagnosis not present

## 2016-08-18 DIAGNOSIS — D689 Coagulation defect, unspecified: Secondary | ICD-10-CM | POA: Diagnosis not present

## 2016-08-20 DIAGNOSIS — D689 Coagulation defect, unspecified: Secondary | ICD-10-CM | POA: Diagnosis not present

## 2016-08-20 DIAGNOSIS — N186 End stage renal disease: Secondary | ICD-10-CM | POA: Diagnosis not present

## 2016-08-20 DIAGNOSIS — D509 Iron deficiency anemia, unspecified: Secondary | ICD-10-CM | POA: Diagnosis not present

## 2016-08-23 DIAGNOSIS — D509 Iron deficiency anemia, unspecified: Secondary | ICD-10-CM | POA: Diagnosis not present

## 2016-08-23 DIAGNOSIS — N186 End stage renal disease: Secondary | ICD-10-CM | POA: Diagnosis not present

## 2016-08-23 DIAGNOSIS — D689 Coagulation defect, unspecified: Secondary | ICD-10-CM | POA: Diagnosis not present

## 2016-08-25 DIAGNOSIS — N186 End stage renal disease: Secondary | ICD-10-CM | POA: Diagnosis not present

## 2016-08-25 DIAGNOSIS — D509 Iron deficiency anemia, unspecified: Secondary | ICD-10-CM | POA: Diagnosis not present

## 2016-08-25 DIAGNOSIS — D689 Coagulation defect, unspecified: Secondary | ICD-10-CM | POA: Diagnosis not present

## 2016-08-27 DIAGNOSIS — N186 End stage renal disease: Secondary | ICD-10-CM | POA: Diagnosis not present

## 2016-08-27 DIAGNOSIS — D509 Iron deficiency anemia, unspecified: Secondary | ICD-10-CM | POA: Diagnosis not present

## 2016-08-27 DIAGNOSIS — D689 Coagulation defect, unspecified: Secondary | ICD-10-CM | POA: Diagnosis not present

## 2016-08-30 DIAGNOSIS — D509 Iron deficiency anemia, unspecified: Secondary | ICD-10-CM | POA: Diagnosis not present

## 2016-08-30 DIAGNOSIS — N186 End stage renal disease: Secondary | ICD-10-CM | POA: Diagnosis not present

## 2016-08-30 DIAGNOSIS — D689 Coagulation defect, unspecified: Secondary | ICD-10-CM | POA: Diagnosis not present

## 2016-09-01 DIAGNOSIS — N186 End stage renal disease: Secondary | ICD-10-CM | POA: Diagnosis not present

## 2016-09-01 DIAGNOSIS — D509 Iron deficiency anemia, unspecified: Secondary | ICD-10-CM | POA: Diagnosis not present

## 2016-09-01 DIAGNOSIS — D689 Coagulation defect, unspecified: Secondary | ICD-10-CM | POA: Diagnosis not present

## 2016-09-03 DIAGNOSIS — D689 Coagulation defect, unspecified: Secondary | ICD-10-CM | POA: Diagnosis not present

## 2016-09-03 DIAGNOSIS — N186 End stage renal disease: Secondary | ICD-10-CM | POA: Diagnosis not present

## 2016-09-03 DIAGNOSIS — D509 Iron deficiency anemia, unspecified: Secondary | ICD-10-CM | POA: Diagnosis not present

## 2016-09-06 DIAGNOSIS — D509 Iron deficiency anemia, unspecified: Secondary | ICD-10-CM | POA: Diagnosis not present

## 2016-09-06 DIAGNOSIS — N186 End stage renal disease: Secondary | ICD-10-CM | POA: Diagnosis not present

## 2016-09-06 DIAGNOSIS — D689 Coagulation defect, unspecified: Secondary | ICD-10-CM | POA: Diagnosis not present

## 2016-09-08 DIAGNOSIS — D509 Iron deficiency anemia, unspecified: Secondary | ICD-10-CM | POA: Diagnosis not present

## 2016-09-08 DIAGNOSIS — N186 End stage renal disease: Secondary | ICD-10-CM | POA: Diagnosis not present

## 2016-09-08 DIAGNOSIS — D689 Coagulation defect, unspecified: Secondary | ICD-10-CM | POA: Diagnosis not present

## 2016-09-10 DIAGNOSIS — D509 Iron deficiency anemia, unspecified: Secondary | ICD-10-CM | POA: Diagnosis not present

## 2016-09-10 DIAGNOSIS — D689 Coagulation defect, unspecified: Secondary | ICD-10-CM | POA: Diagnosis not present

## 2016-09-10 DIAGNOSIS — N186 End stage renal disease: Secondary | ICD-10-CM | POA: Diagnosis not present

## 2016-09-11 DIAGNOSIS — N186 End stage renal disease: Secondary | ICD-10-CM | POA: Diagnosis not present

## 2016-09-11 DIAGNOSIS — T8690 Unspecified complication of unspecified transplanted organ and tissue: Secondary | ICD-10-CM | POA: Diagnosis not present

## 2016-09-11 DIAGNOSIS — Z992 Dependence on renal dialysis: Secondary | ICD-10-CM | POA: Diagnosis not present

## 2016-09-13 DIAGNOSIS — N186 End stage renal disease: Secondary | ICD-10-CM | POA: Diagnosis not present

## 2016-09-13 DIAGNOSIS — D689 Coagulation defect, unspecified: Secondary | ICD-10-CM | POA: Diagnosis not present

## 2016-09-13 DIAGNOSIS — N2581 Secondary hyperparathyroidism of renal origin: Secondary | ICD-10-CM | POA: Diagnosis not present

## 2016-09-17 DIAGNOSIS — D689 Coagulation defect, unspecified: Secondary | ICD-10-CM | POA: Diagnosis not present

## 2016-09-17 DIAGNOSIS — N186 End stage renal disease: Secondary | ICD-10-CM | POA: Diagnosis not present

## 2016-09-17 DIAGNOSIS — N2581 Secondary hyperparathyroidism of renal origin: Secondary | ICD-10-CM | POA: Diagnosis not present

## 2016-09-20 DIAGNOSIS — N2581 Secondary hyperparathyroidism of renal origin: Secondary | ICD-10-CM | POA: Diagnosis not present

## 2016-09-20 DIAGNOSIS — D689 Coagulation defect, unspecified: Secondary | ICD-10-CM | POA: Diagnosis not present

## 2016-09-20 DIAGNOSIS — N186 End stage renal disease: Secondary | ICD-10-CM | POA: Diagnosis not present

## 2016-09-23 DIAGNOSIS — D689 Coagulation defect, unspecified: Secondary | ICD-10-CM | POA: Diagnosis not present

## 2016-09-23 DIAGNOSIS — N186 End stage renal disease: Secondary | ICD-10-CM | POA: Diagnosis not present

## 2016-09-23 DIAGNOSIS — N2581 Secondary hyperparathyroidism of renal origin: Secondary | ICD-10-CM | POA: Diagnosis not present

## 2016-09-27 DIAGNOSIS — D689 Coagulation defect, unspecified: Secondary | ICD-10-CM | POA: Diagnosis not present

## 2016-09-27 DIAGNOSIS — N2581 Secondary hyperparathyroidism of renal origin: Secondary | ICD-10-CM | POA: Diagnosis not present

## 2016-09-27 DIAGNOSIS — N186 End stage renal disease: Secondary | ICD-10-CM | POA: Diagnosis not present

## 2016-09-29 DIAGNOSIS — N2581 Secondary hyperparathyroidism of renal origin: Secondary | ICD-10-CM | POA: Diagnosis not present

## 2016-09-29 DIAGNOSIS — D689 Coagulation defect, unspecified: Secondary | ICD-10-CM | POA: Diagnosis not present

## 2016-09-29 DIAGNOSIS — N186 End stage renal disease: Secondary | ICD-10-CM | POA: Diagnosis not present

## 2016-10-01 DIAGNOSIS — N2581 Secondary hyperparathyroidism of renal origin: Secondary | ICD-10-CM | POA: Diagnosis not present

## 2016-10-01 DIAGNOSIS — D689 Coagulation defect, unspecified: Secondary | ICD-10-CM | POA: Diagnosis not present

## 2016-10-01 DIAGNOSIS — N186 End stage renal disease: Secondary | ICD-10-CM | POA: Diagnosis not present

## 2016-10-04 DIAGNOSIS — N186 End stage renal disease: Secondary | ICD-10-CM | POA: Diagnosis not present

## 2016-10-04 DIAGNOSIS — D689 Coagulation defect, unspecified: Secondary | ICD-10-CM | POA: Diagnosis not present

## 2016-10-04 DIAGNOSIS — N2581 Secondary hyperparathyroidism of renal origin: Secondary | ICD-10-CM | POA: Diagnosis not present

## 2016-10-06 DIAGNOSIS — N186 End stage renal disease: Secondary | ICD-10-CM | POA: Diagnosis not present

## 2016-10-06 DIAGNOSIS — N2581 Secondary hyperparathyroidism of renal origin: Secondary | ICD-10-CM | POA: Diagnosis not present

## 2016-10-06 DIAGNOSIS — D689 Coagulation defect, unspecified: Secondary | ICD-10-CM | POA: Diagnosis not present

## 2016-10-08 DIAGNOSIS — N186 End stage renal disease: Secondary | ICD-10-CM | POA: Diagnosis not present

## 2016-10-08 DIAGNOSIS — N2581 Secondary hyperparathyroidism of renal origin: Secondary | ICD-10-CM | POA: Diagnosis not present

## 2016-10-08 DIAGNOSIS — D689 Coagulation defect, unspecified: Secondary | ICD-10-CM | POA: Diagnosis not present

## 2016-10-11 DIAGNOSIS — N2581 Secondary hyperparathyroidism of renal origin: Secondary | ICD-10-CM | POA: Diagnosis not present

## 2016-10-11 DIAGNOSIS — D689 Coagulation defect, unspecified: Secondary | ICD-10-CM | POA: Diagnosis not present

## 2016-10-11 DIAGNOSIS — N186 End stage renal disease: Secondary | ICD-10-CM | POA: Diagnosis not present

## 2016-10-12 DIAGNOSIS — Z992 Dependence on renal dialysis: Secondary | ICD-10-CM | POA: Diagnosis not present

## 2016-10-12 DIAGNOSIS — T8690 Unspecified complication of unspecified transplanted organ and tissue: Secondary | ICD-10-CM | POA: Diagnosis not present

## 2016-10-12 DIAGNOSIS — N186 End stage renal disease: Secondary | ICD-10-CM | POA: Diagnosis not present

## 2016-10-13 DIAGNOSIS — N2581 Secondary hyperparathyroidism of renal origin: Secondary | ICD-10-CM | POA: Diagnosis not present

## 2016-10-13 DIAGNOSIS — D689 Coagulation defect, unspecified: Secondary | ICD-10-CM | POA: Diagnosis not present

## 2016-10-13 DIAGNOSIS — N186 End stage renal disease: Secondary | ICD-10-CM | POA: Diagnosis not present

## 2016-10-18 DIAGNOSIS — N186 End stage renal disease: Secondary | ICD-10-CM | POA: Diagnosis not present

## 2016-10-18 DIAGNOSIS — D689 Coagulation defect, unspecified: Secondary | ICD-10-CM | POA: Diagnosis not present

## 2016-10-18 DIAGNOSIS — N2581 Secondary hyperparathyroidism of renal origin: Secondary | ICD-10-CM | POA: Diagnosis not present

## 2016-10-20 DIAGNOSIS — N186 End stage renal disease: Secondary | ICD-10-CM | POA: Diagnosis not present

## 2016-10-20 DIAGNOSIS — D689 Coagulation defect, unspecified: Secondary | ICD-10-CM | POA: Diagnosis not present

## 2016-10-20 DIAGNOSIS — N2581 Secondary hyperparathyroidism of renal origin: Secondary | ICD-10-CM | POA: Diagnosis not present

## 2016-10-22 DIAGNOSIS — N2581 Secondary hyperparathyroidism of renal origin: Secondary | ICD-10-CM | POA: Diagnosis not present

## 2016-10-22 DIAGNOSIS — D689 Coagulation defect, unspecified: Secondary | ICD-10-CM | POA: Diagnosis not present

## 2016-10-22 DIAGNOSIS — N186 End stage renal disease: Secondary | ICD-10-CM | POA: Diagnosis not present

## 2016-10-25 DIAGNOSIS — N186 End stage renal disease: Secondary | ICD-10-CM | POA: Diagnosis not present

## 2016-10-25 DIAGNOSIS — D689 Coagulation defect, unspecified: Secondary | ICD-10-CM | POA: Diagnosis not present

## 2016-10-25 DIAGNOSIS — N2581 Secondary hyperparathyroidism of renal origin: Secondary | ICD-10-CM | POA: Diagnosis not present

## 2016-10-27 DIAGNOSIS — N186 End stage renal disease: Secondary | ICD-10-CM | POA: Diagnosis not present

## 2016-10-27 DIAGNOSIS — D689 Coagulation defect, unspecified: Secondary | ICD-10-CM | POA: Diagnosis not present

## 2016-10-27 DIAGNOSIS — N2581 Secondary hyperparathyroidism of renal origin: Secondary | ICD-10-CM | POA: Diagnosis not present

## 2016-10-29 DIAGNOSIS — N2581 Secondary hyperparathyroidism of renal origin: Secondary | ICD-10-CM | POA: Diagnosis not present

## 2016-10-29 DIAGNOSIS — N186 End stage renal disease: Secondary | ICD-10-CM | POA: Diagnosis not present

## 2016-10-29 DIAGNOSIS — D689 Coagulation defect, unspecified: Secondary | ICD-10-CM | POA: Diagnosis not present

## 2016-11-01 DIAGNOSIS — N186 End stage renal disease: Secondary | ICD-10-CM | POA: Diagnosis not present

## 2016-11-01 DIAGNOSIS — N2581 Secondary hyperparathyroidism of renal origin: Secondary | ICD-10-CM | POA: Diagnosis not present

## 2016-11-01 DIAGNOSIS — D689 Coagulation defect, unspecified: Secondary | ICD-10-CM | POA: Diagnosis not present

## 2016-11-05 DIAGNOSIS — N2581 Secondary hyperparathyroidism of renal origin: Secondary | ICD-10-CM | POA: Diagnosis not present

## 2016-11-05 DIAGNOSIS — D689 Coagulation defect, unspecified: Secondary | ICD-10-CM | POA: Diagnosis not present

## 2016-11-05 DIAGNOSIS — N186 End stage renal disease: Secondary | ICD-10-CM | POA: Diagnosis not present

## 2016-11-08 DIAGNOSIS — N186 End stage renal disease: Secondary | ICD-10-CM | POA: Diagnosis not present

## 2016-11-08 DIAGNOSIS — N2581 Secondary hyperparathyroidism of renal origin: Secondary | ICD-10-CM | POA: Diagnosis not present

## 2016-11-08 DIAGNOSIS — D689 Coagulation defect, unspecified: Secondary | ICD-10-CM | POA: Diagnosis not present

## 2016-11-09 DIAGNOSIS — Z992 Dependence on renal dialysis: Secondary | ICD-10-CM | POA: Diagnosis not present

## 2016-11-09 DIAGNOSIS — N186 End stage renal disease: Secondary | ICD-10-CM | POA: Diagnosis not present

## 2016-11-09 DIAGNOSIS — T8690 Unspecified complication of unspecified transplanted organ and tissue: Secondary | ICD-10-CM | POA: Diagnosis not present

## 2016-11-12 DIAGNOSIS — D689 Coagulation defect, unspecified: Secondary | ICD-10-CM | POA: Diagnosis not present

## 2016-11-12 DIAGNOSIS — N2581 Secondary hyperparathyroidism of renal origin: Secondary | ICD-10-CM | POA: Diagnosis not present

## 2016-11-12 DIAGNOSIS — N186 End stage renal disease: Secondary | ICD-10-CM | POA: Diagnosis not present

## 2016-11-15 DIAGNOSIS — N186 End stage renal disease: Secondary | ICD-10-CM | POA: Diagnosis not present

## 2016-11-15 DIAGNOSIS — N2581 Secondary hyperparathyroidism of renal origin: Secondary | ICD-10-CM | POA: Diagnosis not present

## 2016-11-15 DIAGNOSIS — D689 Coagulation defect, unspecified: Secondary | ICD-10-CM | POA: Diagnosis not present

## 2016-11-18 DIAGNOSIS — N2581 Secondary hyperparathyroidism of renal origin: Secondary | ICD-10-CM | POA: Diagnosis not present

## 2016-11-18 DIAGNOSIS — N186 End stage renal disease: Secondary | ICD-10-CM | POA: Diagnosis not present

## 2016-11-18 DIAGNOSIS — D689 Coagulation defect, unspecified: Secondary | ICD-10-CM | POA: Diagnosis not present

## 2016-11-22 DIAGNOSIS — N186 End stage renal disease: Secondary | ICD-10-CM | POA: Diagnosis not present

## 2016-11-22 DIAGNOSIS — D689 Coagulation defect, unspecified: Secondary | ICD-10-CM | POA: Diagnosis not present

## 2016-11-22 DIAGNOSIS — N2581 Secondary hyperparathyroidism of renal origin: Secondary | ICD-10-CM | POA: Diagnosis not present

## 2016-11-24 DIAGNOSIS — D689 Coagulation defect, unspecified: Secondary | ICD-10-CM | POA: Diagnosis not present

## 2016-11-24 DIAGNOSIS — N186 End stage renal disease: Secondary | ICD-10-CM | POA: Diagnosis not present

## 2016-11-24 DIAGNOSIS — N2581 Secondary hyperparathyroidism of renal origin: Secondary | ICD-10-CM | POA: Diagnosis not present

## 2016-11-26 DIAGNOSIS — D689 Coagulation defect, unspecified: Secondary | ICD-10-CM | POA: Diagnosis not present

## 2016-11-26 DIAGNOSIS — N186 End stage renal disease: Secondary | ICD-10-CM | POA: Diagnosis not present

## 2016-11-26 DIAGNOSIS — N2581 Secondary hyperparathyroidism of renal origin: Secondary | ICD-10-CM | POA: Diagnosis not present

## 2016-11-29 DIAGNOSIS — N186 End stage renal disease: Secondary | ICD-10-CM | POA: Diagnosis not present

## 2016-11-29 DIAGNOSIS — D689 Coagulation defect, unspecified: Secondary | ICD-10-CM | POA: Diagnosis not present

## 2016-11-29 DIAGNOSIS — N2581 Secondary hyperparathyroidism of renal origin: Secondary | ICD-10-CM | POA: Diagnosis not present

## 2016-12-01 DIAGNOSIS — N2581 Secondary hyperparathyroidism of renal origin: Secondary | ICD-10-CM | POA: Diagnosis not present

## 2016-12-01 DIAGNOSIS — N186 End stage renal disease: Secondary | ICD-10-CM | POA: Diagnosis not present

## 2016-12-01 DIAGNOSIS — D689 Coagulation defect, unspecified: Secondary | ICD-10-CM | POA: Diagnosis not present

## 2016-12-03 DIAGNOSIS — N2581 Secondary hyperparathyroidism of renal origin: Secondary | ICD-10-CM | POA: Diagnosis not present

## 2016-12-03 DIAGNOSIS — D689 Coagulation defect, unspecified: Secondary | ICD-10-CM | POA: Diagnosis not present

## 2016-12-03 DIAGNOSIS — N186 End stage renal disease: Secondary | ICD-10-CM | POA: Diagnosis not present

## 2016-12-06 DIAGNOSIS — D689 Coagulation defect, unspecified: Secondary | ICD-10-CM | POA: Diagnosis not present

## 2016-12-06 DIAGNOSIS — N186 End stage renal disease: Secondary | ICD-10-CM | POA: Diagnosis not present

## 2016-12-06 DIAGNOSIS — N2581 Secondary hyperparathyroidism of renal origin: Secondary | ICD-10-CM | POA: Diagnosis not present

## 2016-12-07 DIAGNOSIS — N186 End stage renal disease: Secondary | ICD-10-CM | POA: Diagnosis not present

## 2016-12-07 DIAGNOSIS — Z992 Dependence on renal dialysis: Secondary | ICD-10-CM | POA: Diagnosis not present

## 2016-12-07 DIAGNOSIS — T82858A Stenosis of vascular prosthetic devices, implants and grafts, initial encounter: Secondary | ICD-10-CM | POA: Diagnosis not present

## 2016-12-07 DIAGNOSIS — I871 Compression of vein: Secondary | ICD-10-CM | POA: Diagnosis not present

## 2016-12-08 DIAGNOSIS — D689 Coagulation defect, unspecified: Secondary | ICD-10-CM | POA: Diagnosis not present

## 2016-12-08 DIAGNOSIS — N186 End stage renal disease: Secondary | ICD-10-CM | POA: Diagnosis not present

## 2016-12-08 DIAGNOSIS — N2581 Secondary hyperparathyroidism of renal origin: Secondary | ICD-10-CM | POA: Diagnosis not present

## 2016-12-10 DIAGNOSIS — N186 End stage renal disease: Secondary | ICD-10-CM | POA: Diagnosis not present

## 2016-12-10 DIAGNOSIS — Z992 Dependence on renal dialysis: Secondary | ICD-10-CM | POA: Diagnosis not present

## 2016-12-10 DIAGNOSIS — T8690 Unspecified complication of unspecified transplanted organ and tissue: Secondary | ICD-10-CM | POA: Diagnosis not present

## 2016-12-13 DIAGNOSIS — N186 End stage renal disease: Secondary | ICD-10-CM | POA: Diagnosis not present

## 2016-12-13 DIAGNOSIS — N2581 Secondary hyperparathyroidism of renal origin: Secondary | ICD-10-CM | POA: Diagnosis not present

## 2016-12-13 DIAGNOSIS — D689 Coagulation defect, unspecified: Secondary | ICD-10-CM | POA: Diagnosis not present

## 2016-12-15 DIAGNOSIS — N2581 Secondary hyperparathyroidism of renal origin: Secondary | ICD-10-CM | POA: Diagnosis not present

## 2016-12-15 DIAGNOSIS — N186 End stage renal disease: Secondary | ICD-10-CM | POA: Diagnosis not present

## 2016-12-15 DIAGNOSIS — D689 Coagulation defect, unspecified: Secondary | ICD-10-CM | POA: Diagnosis not present

## 2016-12-19 DIAGNOSIS — N2581 Secondary hyperparathyroidism of renal origin: Secondary | ICD-10-CM | POA: Diagnosis not present

## 2016-12-19 DIAGNOSIS — D689 Coagulation defect, unspecified: Secondary | ICD-10-CM | POA: Diagnosis not present

## 2016-12-19 DIAGNOSIS — N186 End stage renal disease: Secondary | ICD-10-CM | POA: Diagnosis not present

## 2016-12-22 DIAGNOSIS — N186 End stage renal disease: Secondary | ICD-10-CM | POA: Diagnosis not present

## 2016-12-22 DIAGNOSIS — D689 Coagulation defect, unspecified: Secondary | ICD-10-CM | POA: Diagnosis not present

## 2016-12-22 DIAGNOSIS — N2581 Secondary hyperparathyroidism of renal origin: Secondary | ICD-10-CM | POA: Diagnosis not present

## 2016-12-24 DIAGNOSIS — D689 Coagulation defect, unspecified: Secondary | ICD-10-CM | POA: Diagnosis not present

## 2016-12-24 DIAGNOSIS — N186 End stage renal disease: Secondary | ICD-10-CM | POA: Diagnosis not present

## 2016-12-24 DIAGNOSIS — N2581 Secondary hyperparathyroidism of renal origin: Secondary | ICD-10-CM | POA: Diagnosis not present

## 2016-12-27 DIAGNOSIS — N2581 Secondary hyperparathyroidism of renal origin: Secondary | ICD-10-CM | POA: Diagnosis not present

## 2016-12-27 DIAGNOSIS — N186 End stage renal disease: Secondary | ICD-10-CM | POA: Diagnosis not present

## 2016-12-27 DIAGNOSIS — D689 Coagulation defect, unspecified: Secondary | ICD-10-CM | POA: Diagnosis not present

## 2016-12-29 DIAGNOSIS — N186 End stage renal disease: Secondary | ICD-10-CM | POA: Diagnosis not present

## 2016-12-29 DIAGNOSIS — N2581 Secondary hyperparathyroidism of renal origin: Secondary | ICD-10-CM | POA: Diagnosis not present

## 2016-12-29 DIAGNOSIS — D689 Coagulation defect, unspecified: Secondary | ICD-10-CM | POA: Diagnosis not present

## 2016-12-31 DIAGNOSIS — N186 End stage renal disease: Secondary | ICD-10-CM | POA: Diagnosis not present

## 2016-12-31 DIAGNOSIS — N2581 Secondary hyperparathyroidism of renal origin: Secondary | ICD-10-CM | POA: Diagnosis not present

## 2016-12-31 DIAGNOSIS — D689 Coagulation defect, unspecified: Secondary | ICD-10-CM | POA: Diagnosis not present

## 2017-01-03 DIAGNOSIS — D689 Coagulation defect, unspecified: Secondary | ICD-10-CM | POA: Diagnosis not present

## 2017-01-03 DIAGNOSIS — N186 End stage renal disease: Secondary | ICD-10-CM | POA: Diagnosis not present

## 2017-01-03 DIAGNOSIS — N2581 Secondary hyperparathyroidism of renal origin: Secondary | ICD-10-CM | POA: Diagnosis not present

## 2017-01-05 DIAGNOSIS — N2581 Secondary hyperparathyroidism of renal origin: Secondary | ICD-10-CM | POA: Diagnosis not present

## 2017-01-05 DIAGNOSIS — D689 Coagulation defect, unspecified: Secondary | ICD-10-CM | POA: Diagnosis not present

## 2017-01-05 DIAGNOSIS — N186 End stage renal disease: Secondary | ICD-10-CM | POA: Diagnosis not present

## 2017-01-07 DIAGNOSIS — D689 Coagulation defect, unspecified: Secondary | ICD-10-CM | POA: Diagnosis not present

## 2017-01-07 DIAGNOSIS — N2581 Secondary hyperparathyroidism of renal origin: Secondary | ICD-10-CM | POA: Diagnosis not present

## 2017-01-07 DIAGNOSIS — N186 End stage renal disease: Secondary | ICD-10-CM | POA: Diagnosis not present

## 2017-01-09 DIAGNOSIS — T8690 Unspecified complication of unspecified transplanted organ and tissue: Secondary | ICD-10-CM | POA: Diagnosis not present

## 2017-01-09 DIAGNOSIS — N186 End stage renal disease: Secondary | ICD-10-CM | POA: Diagnosis not present

## 2017-01-09 DIAGNOSIS — Z992 Dependence on renal dialysis: Secondary | ICD-10-CM | POA: Diagnosis not present

## 2017-01-10 DIAGNOSIS — D689 Coagulation defect, unspecified: Secondary | ICD-10-CM | POA: Diagnosis not present

## 2017-01-10 DIAGNOSIS — N2581 Secondary hyperparathyroidism of renal origin: Secondary | ICD-10-CM | POA: Diagnosis not present

## 2017-01-10 DIAGNOSIS — N186 End stage renal disease: Secondary | ICD-10-CM | POA: Diagnosis not present

## 2017-01-13 DIAGNOSIS — D689 Coagulation defect, unspecified: Secondary | ICD-10-CM | POA: Diagnosis not present

## 2017-01-13 DIAGNOSIS — N2581 Secondary hyperparathyroidism of renal origin: Secondary | ICD-10-CM | POA: Diagnosis not present

## 2017-01-13 DIAGNOSIS — N186 End stage renal disease: Secondary | ICD-10-CM | POA: Diagnosis not present

## 2017-01-17 DIAGNOSIS — N2581 Secondary hyperparathyroidism of renal origin: Secondary | ICD-10-CM | POA: Diagnosis not present

## 2017-01-17 DIAGNOSIS — N186 End stage renal disease: Secondary | ICD-10-CM | POA: Diagnosis not present

## 2017-01-17 DIAGNOSIS — D689 Coagulation defect, unspecified: Secondary | ICD-10-CM | POA: Diagnosis not present

## 2017-01-19 DIAGNOSIS — D689 Coagulation defect, unspecified: Secondary | ICD-10-CM | POA: Diagnosis not present

## 2017-01-19 DIAGNOSIS — N186 End stage renal disease: Secondary | ICD-10-CM | POA: Diagnosis not present

## 2017-01-19 DIAGNOSIS — N2581 Secondary hyperparathyroidism of renal origin: Secondary | ICD-10-CM | POA: Diagnosis not present

## 2017-01-21 DIAGNOSIS — N186 End stage renal disease: Secondary | ICD-10-CM | POA: Diagnosis not present

## 2017-01-21 DIAGNOSIS — N2581 Secondary hyperparathyroidism of renal origin: Secondary | ICD-10-CM | POA: Diagnosis not present

## 2017-01-21 DIAGNOSIS — D689 Coagulation defect, unspecified: Secondary | ICD-10-CM | POA: Diagnosis not present

## 2017-01-24 DIAGNOSIS — D689 Coagulation defect, unspecified: Secondary | ICD-10-CM | POA: Diagnosis not present

## 2017-01-24 DIAGNOSIS — N2581 Secondary hyperparathyroidism of renal origin: Secondary | ICD-10-CM | POA: Diagnosis not present

## 2017-01-24 DIAGNOSIS — N186 End stage renal disease: Secondary | ICD-10-CM | POA: Diagnosis not present

## 2017-01-28 DIAGNOSIS — N186 End stage renal disease: Secondary | ICD-10-CM | POA: Diagnosis not present

## 2017-01-28 DIAGNOSIS — D689 Coagulation defect, unspecified: Secondary | ICD-10-CM | POA: Diagnosis not present

## 2017-01-28 DIAGNOSIS — N2581 Secondary hyperparathyroidism of renal origin: Secondary | ICD-10-CM | POA: Diagnosis not present

## 2017-01-31 DIAGNOSIS — N2581 Secondary hyperparathyroidism of renal origin: Secondary | ICD-10-CM | POA: Diagnosis not present

## 2017-01-31 DIAGNOSIS — N186 End stage renal disease: Secondary | ICD-10-CM | POA: Diagnosis not present

## 2017-01-31 DIAGNOSIS — D689 Coagulation defect, unspecified: Secondary | ICD-10-CM | POA: Diagnosis not present

## 2017-02-02 DIAGNOSIS — D689 Coagulation defect, unspecified: Secondary | ICD-10-CM | POA: Diagnosis not present

## 2017-02-02 DIAGNOSIS — N186 End stage renal disease: Secondary | ICD-10-CM | POA: Diagnosis not present

## 2017-02-02 DIAGNOSIS — N2581 Secondary hyperparathyroidism of renal origin: Secondary | ICD-10-CM | POA: Diagnosis not present

## 2017-02-04 DIAGNOSIS — N2581 Secondary hyperparathyroidism of renal origin: Secondary | ICD-10-CM | POA: Diagnosis not present

## 2017-02-04 DIAGNOSIS — N186 End stage renal disease: Secondary | ICD-10-CM | POA: Diagnosis not present

## 2017-02-04 DIAGNOSIS — D689 Coagulation defect, unspecified: Secondary | ICD-10-CM | POA: Diagnosis not present

## 2017-02-07 DIAGNOSIS — N186 End stage renal disease: Secondary | ICD-10-CM | POA: Diagnosis not present

## 2017-02-07 DIAGNOSIS — N2581 Secondary hyperparathyroidism of renal origin: Secondary | ICD-10-CM | POA: Diagnosis not present

## 2017-02-07 DIAGNOSIS — D689 Coagulation defect, unspecified: Secondary | ICD-10-CM | POA: Diagnosis not present

## 2017-02-09 DIAGNOSIS — N186 End stage renal disease: Secondary | ICD-10-CM | POA: Diagnosis not present

## 2017-02-09 DIAGNOSIS — T8690 Unspecified complication of unspecified transplanted organ and tissue: Secondary | ICD-10-CM | POA: Diagnosis not present

## 2017-02-09 DIAGNOSIS — Z992 Dependence on renal dialysis: Secondary | ICD-10-CM | POA: Diagnosis not present

## 2017-02-09 DIAGNOSIS — D689 Coagulation defect, unspecified: Secondary | ICD-10-CM | POA: Diagnosis not present

## 2017-02-09 DIAGNOSIS — N2581 Secondary hyperparathyroidism of renal origin: Secondary | ICD-10-CM | POA: Diagnosis not present

## 2017-02-13 DIAGNOSIS — D689 Coagulation defect, unspecified: Secondary | ICD-10-CM | POA: Diagnosis not present

## 2017-02-13 DIAGNOSIS — N186 End stage renal disease: Secondary | ICD-10-CM | POA: Diagnosis not present

## 2017-02-13 DIAGNOSIS — N2581 Secondary hyperparathyroidism of renal origin: Secondary | ICD-10-CM | POA: Diagnosis not present

## 2017-02-14 DIAGNOSIS — N186 End stage renal disease: Secondary | ICD-10-CM | POA: Diagnosis not present

## 2017-02-14 DIAGNOSIS — N2581 Secondary hyperparathyroidism of renal origin: Secondary | ICD-10-CM | POA: Diagnosis not present

## 2017-02-14 DIAGNOSIS — D689 Coagulation defect, unspecified: Secondary | ICD-10-CM | POA: Diagnosis not present

## 2017-02-15 DIAGNOSIS — N186 End stage renal disease: Secondary | ICD-10-CM | POA: Diagnosis not present

## 2017-02-15 DIAGNOSIS — I871 Compression of vein: Secondary | ICD-10-CM | POA: Diagnosis not present

## 2017-02-15 DIAGNOSIS — T82858A Stenosis of vascular prosthetic devices, implants and grafts, initial encounter: Secondary | ICD-10-CM | POA: Diagnosis not present

## 2017-02-15 DIAGNOSIS — Z992 Dependence on renal dialysis: Secondary | ICD-10-CM | POA: Diagnosis not present

## 2017-02-16 DIAGNOSIS — N186 End stage renal disease: Secondary | ICD-10-CM | POA: Diagnosis not present

## 2017-02-16 DIAGNOSIS — N2581 Secondary hyperparathyroidism of renal origin: Secondary | ICD-10-CM | POA: Diagnosis not present

## 2017-02-16 DIAGNOSIS — D689 Coagulation defect, unspecified: Secondary | ICD-10-CM | POA: Diagnosis not present

## 2017-02-18 DIAGNOSIS — N186 End stage renal disease: Secondary | ICD-10-CM | POA: Diagnosis not present

## 2017-02-18 DIAGNOSIS — N2581 Secondary hyperparathyroidism of renal origin: Secondary | ICD-10-CM | POA: Diagnosis not present

## 2017-02-18 DIAGNOSIS — D689 Coagulation defect, unspecified: Secondary | ICD-10-CM | POA: Diagnosis not present

## 2017-02-21 DIAGNOSIS — N186 End stage renal disease: Secondary | ICD-10-CM | POA: Diagnosis not present

## 2017-02-21 DIAGNOSIS — N2581 Secondary hyperparathyroidism of renal origin: Secondary | ICD-10-CM | POA: Diagnosis not present

## 2017-02-21 DIAGNOSIS — D689 Coagulation defect, unspecified: Secondary | ICD-10-CM | POA: Diagnosis not present

## 2017-02-23 DIAGNOSIS — D689 Coagulation defect, unspecified: Secondary | ICD-10-CM | POA: Diagnosis not present

## 2017-02-23 DIAGNOSIS — N2581 Secondary hyperparathyroidism of renal origin: Secondary | ICD-10-CM | POA: Diagnosis not present

## 2017-02-23 DIAGNOSIS — N186 End stage renal disease: Secondary | ICD-10-CM | POA: Diagnosis not present

## 2017-02-28 DIAGNOSIS — N2581 Secondary hyperparathyroidism of renal origin: Secondary | ICD-10-CM | POA: Diagnosis not present

## 2017-02-28 DIAGNOSIS — D689 Coagulation defect, unspecified: Secondary | ICD-10-CM | POA: Diagnosis not present

## 2017-02-28 DIAGNOSIS — N186 End stage renal disease: Secondary | ICD-10-CM | POA: Diagnosis not present

## 2017-03-02 ENCOUNTER — Encounter: Payer: Self-pay | Admitting: Vascular Surgery

## 2017-03-02 DIAGNOSIS — N2581 Secondary hyperparathyroidism of renal origin: Secondary | ICD-10-CM | POA: Diagnosis not present

## 2017-03-02 DIAGNOSIS — N186 End stage renal disease: Secondary | ICD-10-CM | POA: Diagnosis not present

## 2017-03-02 DIAGNOSIS — D689 Coagulation defect, unspecified: Secondary | ICD-10-CM | POA: Diagnosis not present

## 2017-03-04 DIAGNOSIS — N2581 Secondary hyperparathyroidism of renal origin: Secondary | ICD-10-CM | POA: Diagnosis not present

## 2017-03-04 DIAGNOSIS — D689 Coagulation defect, unspecified: Secondary | ICD-10-CM | POA: Diagnosis not present

## 2017-03-04 DIAGNOSIS — N186 End stage renal disease: Secondary | ICD-10-CM | POA: Diagnosis not present

## 2017-03-07 DIAGNOSIS — N2581 Secondary hyperparathyroidism of renal origin: Secondary | ICD-10-CM | POA: Diagnosis not present

## 2017-03-07 DIAGNOSIS — N186 End stage renal disease: Secondary | ICD-10-CM | POA: Diagnosis not present

## 2017-03-07 DIAGNOSIS — D689 Coagulation defect, unspecified: Secondary | ICD-10-CM | POA: Diagnosis not present

## 2017-03-09 DIAGNOSIS — N2581 Secondary hyperparathyroidism of renal origin: Secondary | ICD-10-CM | POA: Diagnosis not present

## 2017-03-09 DIAGNOSIS — D689 Coagulation defect, unspecified: Secondary | ICD-10-CM | POA: Diagnosis not present

## 2017-03-09 DIAGNOSIS — N186 End stage renal disease: Secondary | ICD-10-CM | POA: Diagnosis not present

## 2017-03-11 DIAGNOSIS — D689 Coagulation defect, unspecified: Secondary | ICD-10-CM | POA: Diagnosis not present

## 2017-03-11 DIAGNOSIS — N186 End stage renal disease: Secondary | ICD-10-CM | POA: Diagnosis not present

## 2017-03-11 DIAGNOSIS — T8690 Unspecified complication of unspecified transplanted organ and tissue: Secondary | ICD-10-CM | POA: Diagnosis not present

## 2017-03-11 DIAGNOSIS — Z992 Dependence on renal dialysis: Secondary | ICD-10-CM | POA: Diagnosis not present

## 2017-03-11 DIAGNOSIS — N2581 Secondary hyperparathyroidism of renal origin: Secondary | ICD-10-CM | POA: Diagnosis not present

## 2017-03-14 DIAGNOSIS — N2581 Secondary hyperparathyroidism of renal origin: Secondary | ICD-10-CM | POA: Diagnosis not present

## 2017-03-14 DIAGNOSIS — N186 End stage renal disease: Secondary | ICD-10-CM | POA: Diagnosis not present

## 2017-03-14 DIAGNOSIS — D689 Coagulation defect, unspecified: Secondary | ICD-10-CM | POA: Diagnosis not present

## 2017-03-16 ENCOUNTER — Encounter: Payer: Self-pay | Admitting: Vascular Surgery

## 2017-03-16 ENCOUNTER — Ambulatory Visit (INDEPENDENT_AMBULATORY_CARE_PROVIDER_SITE_OTHER): Payer: Medicare Other | Admitting: Vascular Surgery

## 2017-03-16 VITALS — BP 138/96 | HR 125 | Temp 99.8°F | Resp 16 | Ht 72.0 in | Wt 153.0 lb

## 2017-03-16 DIAGNOSIS — N186 End stage renal disease: Secondary | ICD-10-CM | POA: Diagnosis not present

## 2017-03-16 DIAGNOSIS — Z992 Dependence on renal dialysis: Secondary | ICD-10-CM

## 2017-03-16 DIAGNOSIS — D689 Coagulation defect, unspecified: Secondary | ICD-10-CM | POA: Diagnosis not present

## 2017-03-16 DIAGNOSIS — N2581 Secondary hyperparathyroidism of renal origin: Secondary | ICD-10-CM | POA: Diagnosis not present

## 2017-03-16 NOTE — Progress Notes (Signed)
History of Present Illness:  Patient is a 41 y.o. year old male who presents with a 3 week history of left axillary firmness, swelling and pain associated with his Basilic fistula.  The pain and swelling have since dissipated over the last few days.  He had HD today without any problems.  The patient underwent a left basilic vein transposition by Dr. Oneida Alar in 2014.  He then underwent angioplasty of a high-grade lesion by Dr. early in 2015 using a 16 x 4 Atlas balloon.  He states he has had procedures with Dr. Augustin Coupe recently.  Today he has no other concerns.     Past Medical History:  Diagnosis Date  . Anemia   . Asthma   . 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 CAD (coronary artery disease)   . Dialysis patient ([place])   . Hypertension   . Kidney failure     Past Surgical History:  Procedure Laterality Date  . FISTULOGRAM Left 06/11/2014   Procedure: FISTULOGRAM;  Surgeon: Rosetta Posner, MD;  Location: [facility] CATH LAB;  Service: Cardiovascular;  Laterality: Left;  arm  . KIDNEY TRANSPLANT     failed transplant, originally done at [facility].   Marland Kitchen PERIPHERAL VASCULAR CATHETERIZATION N/A 08/15/2016   Procedure: A/V Fistulagram;  Surgeon: Angelia Mould, MD;  Location: [place] CV LAB;  Service: Cardiovascular;  Laterality: N/A;  . PERIPHERAL VASCULAR CATHETERIZATION  08/15/2016   Procedure: Peripheral Vascular Balloon Angioplasty;  Surgeon: Angelia Mould, MD;  Location: [place] CV LAB;  Service: Cardiovascular;;     Social History Social History  Substance Use Topics  . Smoking status: Current Every Day Smoker    Packs/day: 1.00  . Smokeless tobacco: Never Used  . Alcohol use No    Family History Family History  Problem Relation Age of Onset  . Diabetes Mother   . Varicose Veins Mother   . Heart disease Father     Allergies  No Known Allergies   Current Outpatient Prescriptions  Medication Sig Dispense Refill  . amLODipine  (NORVASC) 10 MG tablet Take 10 mg by mouth daily.    . calcium acetate (PHOSLO) 667 MG capsule Take 667-2,001 mg by mouth See admin instructions. Takes 2001 mg by mouth 3 times daily with every meal and takes 667 mg by mouth with each snack.    . fosinopril (MONOPRIL) 40 MG tablet Take 40 mg by mouth at bedtime.    Marland Kitchen ibuprofen (ADVIL,MOTRIN) 200 MG tablet Take 400 mg by mouth every 6 (six) hours as needed for headache or moderate pain.     Marland Kitchen labetalol (NORMODYNE) 200 MG tablet Take 200 mg by mouth 2 (two) times daily.    Marland Kitchen lisinopril (PRINIVIL,ZESTRIL) 20 MG tablet Take 20 mg by mouth daily.     No current facility-administered medications for this visit.     ROS:   General:  No weight loss, Fever, chills  HEENT: No recent headaches, no nasal bleeding, no visual changes, no sore throat  Neurologic: No dizziness, blackouts, seizures. No recent symptoms of stroke or mini- stroke. No recent episodes of slurred speech, or temporary blindness.  Cardiac: No recent episodes of chest pain/pressure, no shortness of breath at rest.  No shortness of breath with exertion.  Denies history of atrial fibrillation or irregular heartbeat  Vascular: No history of rest pain in feet.  No history of claudication.  No history of non-healing ulcer, No history  of DVT   Pulmonary: No home oxygen, no productive cough, no hemoptysis,  No asthma or wheezing  Musculoskeletal:  [ ]  Arthritis, [ ]  Low back pain,  [ ]  Joint pain  Hematologic:No history of hypercoagulable state.  No history of easy bleeding.  No history of anemia  Gastrointestinal: No hematochezia or melena,  No gastroesophageal reflux, no trouble swallowing  Urinary: [x ] chronic Kidney disease, [x ] on HD - [ ]  MWF or [x ] TTHS, [ ]  Burning with urination, [ ]  Frequent urination, [ ]  Difficulty urinating;   Skin: No rashes  Psychological: No history of anxiety,  No history of depression   Physical Examination  Vitals:   03/16/17 1538  03/16/17 1543  BP: (!) 142/95 (!) 138/96  Pulse: (!) 125   Resp: 16   Temp: 99.8 F (37.7 C)   TempSrc: Oral   SpO2: 97%   Weight: 153 lb (69.4 kg)   Height: 6' (1.829 m)     Body mass index is 20.75 kg/m.  General:  Alert and oriented, no acute distress HEENT: Normal Neck: No bruit or JVD Pulmonary: Clear to auscultation bilaterally Cardiac: Regular Rate and Rhythm without murmur Gastrointestinal: Soft, non-tender, non-distended, no mass, no scars Skin: No rash Extremity Pulses:  2+ radial, brachial pulses bilaterally.  Palpable thrill throughout the fistula including the questionable area of concern.  Non tender to palpation palpable thrill in the axillary fistula.   Musculoskeletal: No deformity or edema  Neurologic: Upper and lower extremity motor 5/5 and symmetric     ASSESSMENT:  Working left basilic fistula now without pain or edema.   PLAN: Keep an eye on the fistula if the area becomes swollen or painful again call us.  Other wise f/u PRN.  COLLINS, EMMA MAUREEN PA-C [facility]  The patient was seen in conjunction with Dr. Oneida Alar   Agree with above. Patient apparently had some swelling in his fistula which is now resolved. He will follow-up on as-needed basis.  Ruta Hinds, MD [facility] [place] Office: [phone] Pager: [phone]

## 2017-03-18 DIAGNOSIS — D689 Coagulation defect, unspecified: Secondary | ICD-10-CM | POA: Diagnosis not present

## 2017-03-18 DIAGNOSIS — N186 End stage renal disease: Secondary | ICD-10-CM | POA: Diagnosis not present

## 2017-03-18 DIAGNOSIS — N2581 Secondary hyperparathyroidism of renal origin: Secondary | ICD-10-CM | POA: Diagnosis not present

## 2017-03-21 DIAGNOSIS — D689 Coagulation defect, unspecified: Secondary | ICD-10-CM | POA: Diagnosis not present

## 2017-03-21 DIAGNOSIS — N2581 Secondary hyperparathyroidism of renal origin: Secondary | ICD-10-CM | POA: Diagnosis not present

## 2017-03-21 DIAGNOSIS — N186 End stage renal disease: Secondary | ICD-10-CM | POA: Diagnosis not present

## 2017-03-23 DIAGNOSIS — D689 Coagulation defect, unspecified: Secondary | ICD-10-CM | POA: Diagnosis not present

## 2017-03-23 DIAGNOSIS — N2581 Secondary hyperparathyroidism of renal origin: Secondary | ICD-10-CM | POA: Diagnosis not present

## 2017-03-23 DIAGNOSIS — N186 End stage renal disease: Secondary | ICD-10-CM | POA: Diagnosis not present

## 2017-03-25 DIAGNOSIS — N2581 Secondary hyperparathyroidism of renal origin: Secondary | ICD-10-CM | POA: Diagnosis not present

## 2017-03-25 DIAGNOSIS — D689 Coagulation defect, unspecified: Secondary | ICD-10-CM | POA: Diagnosis not present

## 2017-03-25 DIAGNOSIS — N186 End stage renal disease: Secondary | ICD-10-CM | POA: Diagnosis not present

## 2017-03-28 DIAGNOSIS — D689 Coagulation defect, unspecified: Secondary | ICD-10-CM | POA: Diagnosis not present

## 2017-03-28 DIAGNOSIS — N186 End stage renal disease: Secondary | ICD-10-CM | POA: Diagnosis not present

## 2017-03-28 DIAGNOSIS — N2581 Secondary hyperparathyroidism of renal origin: Secondary | ICD-10-CM | POA: Diagnosis not present

## 2017-03-30 DIAGNOSIS — N2581 Secondary hyperparathyroidism of renal origin: Secondary | ICD-10-CM | POA: Diagnosis not present

## 2017-03-30 DIAGNOSIS — D689 Coagulation defect, unspecified: Secondary | ICD-10-CM | POA: Diagnosis not present

## 2017-03-30 DIAGNOSIS — N186 End stage renal disease: Secondary | ICD-10-CM | POA: Diagnosis not present

## 2017-04-04 DIAGNOSIS — N186 End stage renal disease: Secondary | ICD-10-CM | POA: Diagnosis not present

## 2017-04-04 DIAGNOSIS — D689 Coagulation defect, unspecified: Secondary | ICD-10-CM | POA: Diagnosis not present

## 2017-04-04 DIAGNOSIS — N2581 Secondary hyperparathyroidism of renal origin: Secondary | ICD-10-CM | POA: Diagnosis not present

## 2017-04-06 DIAGNOSIS — N186 End stage renal disease: Secondary | ICD-10-CM | POA: Diagnosis not present

## 2017-04-06 DIAGNOSIS — D689 Coagulation defect, unspecified: Secondary | ICD-10-CM | POA: Diagnosis not present

## 2017-04-06 DIAGNOSIS — N2581 Secondary hyperparathyroidism of renal origin: Secondary | ICD-10-CM | POA: Diagnosis not present

## 2017-04-08 DIAGNOSIS — N186 End stage renal disease: Secondary | ICD-10-CM | POA: Diagnosis not present

## 2017-04-08 DIAGNOSIS — D689 Coagulation defect, unspecified: Secondary | ICD-10-CM | POA: Diagnosis not present

## 2017-04-08 DIAGNOSIS — N2581 Secondary hyperparathyroidism of renal origin: Secondary | ICD-10-CM | POA: Diagnosis not present

## 2017-04-11 DIAGNOSIS — D689 Coagulation defect, unspecified: Secondary | ICD-10-CM | POA: Diagnosis not present

## 2017-04-11 DIAGNOSIS — Z992 Dependence on renal dialysis: Secondary | ICD-10-CM | POA: Diagnosis not present

## 2017-04-11 DIAGNOSIS — T8690 Unspecified complication of unspecified transplanted organ and tissue: Secondary | ICD-10-CM | POA: Diagnosis not present

## 2017-04-11 DIAGNOSIS — N2581 Secondary hyperparathyroidism of renal origin: Secondary | ICD-10-CM | POA: Diagnosis not present

## 2017-04-11 DIAGNOSIS — N186 End stage renal disease: Secondary | ICD-10-CM | POA: Diagnosis not present

## 2017-04-13 DIAGNOSIS — N2581 Secondary hyperparathyroidism of renal origin: Secondary | ICD-10-CM | POA: Diagnosis not present

## 2017-04-13 DIAGNOSIS — N186 End stage renal disease: Secondary | ICD-10-CM | POA: Diagnosis not present

## 2017-04-13 DIAGNOSIS — D689 Coagulation defect, unspecified: Secondary | ICD-10-CM | POA: Diagnosis not present

## 2017-04-15 DIAGNOSIS — D689 Coagulation defect, unspecified: Secondary | ICD-10-CM | POA: Diagnosis not present

## 2017-04-15 DIAGNOSIS — N186 End stage renal disease: Secondary | ICD-10-CM | POA: Diagnosis not present

## 2017-04-15 DIAGNOSIS — N2581 Secondary hyperparathyroidism of renal origin: Secondary | ICD-10-CM | POA: Diagnosis not present

## 2017-04-18 DIAGNOSIS — D689 Coagulation defect, unspecified: Secondary | ICD-10-CM | POA: Diagnosis not present

## 2017-04-18 DIAGNOSIS — N186 End stage renal disease: Secondary | ICD-10-CM | POA: Diagnosis not present

## 2017-04-18 DIAGNOSIS — N2581 Secondary hyperparathyroidism of renal origin: Secondary | ICD-10-CM | POA: Diagnosis not present

## 2017-04-20 DIAGNOSIS — D689 Coagulation defect, unspecified: Secondary | ICD-10-CM | POA: Diagnosis not present

## 2017-04-20 DIAGNOSIS — N186 End stage renal disease: Secondary | ICD-10-CM | POA: Diagnosis not present

## 2017-04-20 DIAGNOSIS — N2581 Secondary hyperparathyroidism of renal origin: Secondary | ICD-10-CM | POA: Diagnosis not present

## 2017-04-22 DIAGNOSIS — D689 Coagulation defect, unspecified: Secondary | ICD-10-CM | POA: Diagnosis not present

## 2017-04-22 DIAGNOSIS — N2581 Secondary hyperparathyroidism of renal origin: Secondary | ICD-10-CM | POA: Diagnosis not present

## 2017-04-22 DIAGNOSIS — N186 End stage renal disease: Secondary | ICD-10-CM | POA: Diagnosis not present

## 2017-04-25 DIAGNOSIS — N186 End stage renal disease: Secondary | ICD-10-CM | POA: Diagnosis not present

## 2017-04-25 DIAGNOSIS — D689 Coagulation defect, unspecified: Secondary | ICD-10-CM | POA: Diagnosis not present

## 2017-04-25 DIAGNOSIS — N2581 Secondary hyperparathyroidism of renal origin: Secondary | ICD-10-CM | POA: Diagnosis not present

## 2017-04-27 DIAGNOSIS — D689 Coagulation defect, unspecified: Secondary | ICD-10-CM | POA: Diagnosis not present

## 2017-04-27 DIAGNOSIS — N186 End stage renal disease: Secondary | ICD-10-CM | POA: Diagnosis not present

## 2017-04-27 DIAGNOSIS — N2581 Secondary hyperparathyroidism of renal origin: Secondary | ICD-10-CM | POA: Diagnosis not present

## 2017-04-29 DIAGNOSIS — N186 End stage renal disease: Secondary | ICD-10-CM | POA: Diagnosis not present

## 2017-04-29 DIAGNOSIS — N2581 Secondary hyperparathyroidism of renal origin: Secondary | ICD-10-CM | POA: Diagnosis not present

## 2017-04-29 DIAGNOSIS — D689 Coagulation defect, unspecified: Secondary | ICD-10-CM | POA: Diagnosis not present

## 2017-05-01 DIAGNOSIS — H5202 Hypermetropia, left eye: Secondary | ICD-10-CM | POA: Diagnosis not present

## 2017-05-01 DIAGNOSIS — H402233 Chronic angle-closure glaucoma, bilateral, severe stage: Secondary | ICD-10-CM | POA: Diagnosis not present

## 2017-05-01 DIAGNOSIS — H5203 Hypermetropia, bilateral: Secondary | ICD-10-CM | POA: Diagnosis not present

## 2017-05-01 DIAGNOSIS — H52223 Regular astigmatism, bilateral: Secondary | ICD-10-CM | POA: Diagnosis not present

## 2017-05-01 DIAGNOSIS — H5211 Myopia, right eye: Secondary | ICD-10-CM | POA: Diagnosis not present

## 2017-05-02 DIAGNOSIS — N2581 Secondary hyperparathyroidism of renal origin: Secondary | ICD-10-CM | POA: Diagnosis not present

## 2017-05-02 DIAGNOSIS — D689 Coagulation defect, unspecified: Secondary | ICD-10-CM | POA: Diagnosis not present

## 2017-05-02 DIAGNOSIS — N186 End stage renal disease: Secondary | ICD-10-CM | POA: Diagnosis not present

## 2017-05-04 DIAGNOSIS — N2581 Secondary hyperparathyroidism of renal origin: Secondary | ICD-10-CM | POA: Diagnosis not present

## 2017-05-04 DIAGNOSIS — N186 End stage renal disease: Secondary | ICD-10-CM | POA: Diagnosis not present

## 2017-05-04 DIAGNOSIS — D689 Coagulation defect, unspecified: Secondary | ICD-10-CM | POA: Diagnosis not present

## 2017-05-09 DIAGNOSIS — D689 Coagulation defect, unspecified: Secondary | ICD-10-CM | POA: Diagnosis not present

## 2017-05-09 DIAGNOSIS — N2581 Secondary hyperparathyroidism of renal origin: Secondary | ICD-10-CM | POA: Diagnosis not present

## 2017-05-09 DIAGNOSIS — N186 End stage renal disease: Secondary | ICD-10-CM | POA: Diagnosis not present

## 2017-05-11 DIAGNOSIS — H47323 Drusen of optic disc, bilateral: Secondary | ICD-10-CM | POA: Diagnosis not present

## 2017-05-11 DIAGNOSIS — H4089 Other specified glaucoma: Secondary | ICD-10-CM | POA: Diagnosis not present

## 2017-05-12 DIAGNOSIS — N2581 Secondary hyperparathyroidism of renal origin: Secondary | ICD-10-CM | POA: Diagnosis not present

## 2017-05-12 DIAGNOSIS — T8690 Unspecified complication of unspecified transplanted organ and tissue: Secondary | ICD-10-CM | POA: Diagnosis not present

## 2017-05-12 DIAGNOSIS — Z992 Dependence on renal dialysis: Secondary | ICD-10-CM | POA: Diagnosis not present

## 2017-05-12 DIAGNOSIS — N186 End stage renal disease: Secondary | ICD-10-CM | POA: Diagnosis not present

## 2017-05-12 DIAGNOSIS — D689 Coagulation defect, unspecified: Secondary | ICD-10-CM | POA: Diagnosis not present

## 2017-05-13 DIAGNOSIS — N186 End stage renal disease: Secondary | ICD-10-CM | POA: Diagnosis not present

## 2017-05-13 DIAGNOSIS — Z992 Dependence on renal dialysis: Secondary | ICD-10-CM | POA: Diagnosis not present

## 2017-05-13 DIAGNOSIS — T8690 Unspecified complication of unspecified transplanted organ and tissue: Secondary | ICD-10-CM | POA: Diagnosis not present

## 2017-05-13 DIAGNOSIS — N2581 Secondary hyperparathyroidism of renal origin: Secondary | ICD-10-CM | POA: Diagnosis not present

## 2017-05-13 DIAGNOSIS — D689 Coagulation defect, unspecified: Secondary | ICD-10-CM | POA: Diagnosis not present

## 2017-05-16 DIAGNOSIS — N2581 Secondary hyperparathyroidism of renal origin: Secondary | ICD-10-CM | POA: Diagnosis not present

## 2017-05-16 DIAGNOSIS — N186 End stage renal disease: Secondary | ICD-10-CM | POA: Diagnosis not present

## 2017-05-16 DIAGNOSIS — D689 Coagulation defect, unspecified: Secondary | ICD-10-CM | POA: Diagnosis not present

## 2017-05-18 DIAGNOSIS — N2581 Secondary hyperparathyroidism of renal origin: Secondary | ICD-10-CM | POA: Diagnosis not present

## 2017-05-18 DIAGNOSIS — N186 End stage renal disease: Secondary | ICD-10-CM | POA: Diagnosis not present

## 2017-05-18 DIAGNOSIS — D689 Coagulation defect, unspecified: Secondary | ICD-10-CM | POA: Diagnosis not present

## 2017-05-20 DIAGNOSIS — N186 End stage renal disease: Secondary | ICD-10-CM | POA: Diagnosis not present

## 2017-05-20 DIAGNOSIS — N2581 Secondary hyperparathyroidism of renal origin: Secondary | ICD-10-CM | POA: Diagnosis not present

## 2017-05-20 DIAGNOSIS — D689 Coagulation defect, unspecified: Secondary | ICD-10-CM | POA: Diagnosis not present

## 2017-05-23 DIAGNOSIS — D689 Coagulation defect, unspecified: Secondary | ICD-10-CM | POA: Diagnosis not present

## 2017-05-23 DIAGNOSIS — N186 End stage renal disease: Secondary | ICD-10-CM | POA: Diagnosis not present

## 2017-05-23 DIAGNOSIS — N2581 Secondary hyperparathyroidism of renal origin: Secondary | ICD-10-CM | POA: Diagnosis not present

## 2017-05-26 DIAGNOSIS — N2581 Secondary hyperparathyroidism of renal origin: Secondary | ICD-10-CM | POA: Diagnosis not present

## 2017-05-26 DIAGNOSIS — N186 End stage renal disease: Secondary | ICD-10-CM | POA: Diagnosis not present

## 2017-05-26 DIAGNOSIS — D689 Coagulation defect, unspecified: Secondary | ICD-10-CM | POA: Diagnosis not present

## 2017-05-27 DIAGNOSIS — N186 End stage renal disease: Secondary | ICD-10-CM | POA: Diagnosis not present

## 2017-05-27 DIAGNOSIS — D689 Coagulation defect, unspecified: Secondary | ICD-10-CM | POA: Diagnosis not present

## 2017-05-27 DIAGNOSIS — N2581 Secondary hyperparathyroidism of renal origin: Secondary | ICD-10-CM | POA: Diagnosis not present

## 2017-05-29 ENCOUNTER — Inpatient Hospital Stay (HOSPITAL_COMMUNITY)
Admission: EM | Admit: 2017-05-29 | Discharge: 2017-06-12 | DRG: 393 | Disposition: E | Payer: Medicare Other | Attending: Internal Medicine | Admitting: Internal Medicine

## 2017-05-29 ENCOUNTER — Encounter (HOSPITAL_COMMUNITY): Payer: Self-pay | Admitting: Emergency Medicine

## 2017-05-29 ENCOUNTER — Emergency Department (HOSPITAL_COMMUNITY): Payer: Medicare Other

## 2017-05-29 DIAGNOSIS — E785 Hyperlipidemia, unspecified: Secondary | ICD-10-CM | POA: Diagnosis present

## 2017-05-29 DIAGNOSIS — M898X9 Other specified disorders of bone, unspecified site: Secondary | ICD-10-CM | POA: Diagnosis present

## 2017-05-29 DIAGNOSIS — K625 Hemorrhage of anus and rectum: Secondary | ICD-10-CM | POA: Diagnosis not present

## 2017-05-29 DIAGNOSIS — T8612 Kidney transplant failure: Secondary | ICD-10-CM | POA: Diagnosis present

## 2017-05-29 DIAGNOSIS — Y830 Surgical operation with transplant of whole organ as the cause of abnormal reaction of the patient, or of later complication, without mention of misadventure at the time of the procedure: Secondary | ICD-10-CM | POA: Diagnosis present

## 2017-05-29 DIAGNOSIS — I120 Hypertensive chronic kidney disease with stage 5 chronic kidney disease or end stage renal disease: Secondary | ICD-10-CM | POA: Diagnosis not present

## 2017-05-29 DIAGNOSIS — I2510 Atherosclerotic heart disease of native coronary artery without angina pectoris: Secondary | ICD-10-CM | POA: Diagnosis present

## 2017-05-29 DIAGNOSIS — Z538 Procedure and treatment not carried out for other reasons: Secondary | ICD-10-CM | POA: Diagnosis not present

## 2017-05-29 DIAGNOSIS — F17210 Nicotine dependence, cigarettes, uncomplicated: Secondary | ICD-10-CM | POA: Diagnosis present

## 2017-05-29 DIAGNOSIS — I953 Hypotension of hemodialysis: Secondary | ICD-10-CM | POA: Diagnosis present

## 2017-05-29 DIAGNOSIS — I10 Essential (primary) hypertension: Secondary | ICD-10-CM | POA: Diagnosis present

## 2017-05-29 DIAGNOSIS — H5461 Unqualified visual loss, right eye, normal vision left eye: Secondary | ICD-10-CM | POA: Diagnosis present

## 2017-05-29 DIAGNOSIS — K644 Residual hemorrhoidal skin tags: Secondary | ICD-10-CM | POA: Diagnosis present

## 2017-05-29 DIAGNOSIS — I469 Cardiac arrest, cause unspecified: Secondary | ICD-10-CM | POA: Diagnosis not present

## 2017-05-29 DIAGNOSIS — R109 Unspecified abdominal pain: Secondary | ICD-10-CM

## 2017-05-29 DIAGNOSIS — N2581 Secondary hyperparathyroidism of renal origin: Secondary | ICD-10-CM | POA: Diagnosis not present

## 2017-05-29 DIAGNOSIS — I739 Peripheral vascular disease, unspecified: Secondary | ICD-10-CM | POA: Diagnosis present

## 2017-05-29 DIAGNOSIS — R1032 Left lower quadrant pain: Secondary | ICD-10-CM | POA: Diagnosis not present

## 2017-05-29 DIAGNOSIS — R112 Nausea with vomiting, unspecified: Secondary | ICD-10-CM | POA: Diagnosis not present

## 2017-05-29 DIAGNOSIS — K641 Second degree hemorrhoids: Secondary | ICD-10-CM | POA: Diagnosis present

## 2017-05-29 DIAGNOSIS — K559 Vascular disorder of intestine, unspecified: Principal | ICD-10-CM | POA: Diagnosis present

## 2017-05-29 DIAGNOSIS — N186 End stage renal disease: Secondary | ICD-10-CM | POA: Diagnosis not present

## 2017-05-29 DIAGNOSIS — E860 Dehydration: Secondary | ICD-10-CM | POA: Diagnosis not present

## 2017-05-29 DIAGNOSIS — Z8249 Family history of ischemic heart disease and other diseases of the circulatory system: Secondary | ICD-10-CM

## 2017-05-29 DIAGNOSIS — K921 Melena: Secondary | ICD-10-CM | POA: Diagnosis present

## 2017-05-29 DIAGNOSIS — K920 Hematemesis: Secondary | ICD-10-CM | POA: Diagnosis present

## 2017-05-29 DIAGNOSIS — M310 Hypersensitivity angiitis: Secondary | ICD-10-CM | POA: Diagnosis present

## 2017-05-29 DIAGNOSIS — Z992 Dependence on renal dialysis: Secondary | ICD-10-CM

## 2017-05-29 DIAGNOSIS — K2970 Gastritis, unspecified, without bleeding: Secondary | ICD-10-CM | POA: Diagnosis not present

## 2017-05-29 DIAGNOSIS — R000 Tachycardia, unspecified: Secondary | ICD-10-CM | POA: Diagnosis not present

## 2017-05-29 DIAGNOSIS — R1031 Right lower quadrant pain: Secondary | ICD-10-CM | POA: Diagnosis not present

## 2017-05-29 DIAGNOSIS — D631 Anemia in chronic kidney disease: Secondary | ICD-10-CM | POA: Diagnosis not present

## 2017-05-29 DIAGNOSIS — R110 Nausea: Secondary | ICD-10-CM | POA: Diagnosis not present

## 2017-05-29 DIAGNOSIS — K922 Gastrointestinal hemorrhage, unspecified: Secondary | ICD-10-CM | POA: Diagnosis present

## 2017-05-29 DIAGNOSIS — R1030 Lower abdominal pain, unspecified: Secondary | ICD-10-CM | POA: Diagnosis not present

## 2017-05-29 HISTORY — DX: Atherosclerotic heart disease of native coronary artery without angina pectoris: I25.10

## 2017-05-29 LAB — CBC
HCT: 50.4 % (ref 39.0–52.0)
HCT: 48.8 % (ref 39.0–52.0)
HEMATOCRIT: 48.3 % (ref 39.0–52.0)
HEMOGLOBIN: 16.1 g/dL (ref 13.0–17.0)
Hemoglobin: 17.3 g/dL — ABNORMAL HIGH (ref 13.0–17.0)
Hemoglobin: 16.7 g/dL (ref 13.0–17.0)
MCH: 32 pg (ref 26.0–34.0)
MCH: 30.9 pg (ref 26.0–34.0)
MCH: 31.6 pg (ref 26.0–34.0)
MCHC: 34.3 g/dL (ref 30.0–36.0)
MCHC: 33.3 g/dL (ref 30.0–36.0)
MCHC: 34.2 g/dL (ref 30.0–36.0)
MCV: 93.3 fL (ref 78.0–100.0)
MCV: 92.7 fL (ref 78.0–100.0)
MCV: 92.4 fL (ref 78.0–100.0)
PLATELETS: 187 10*3/uL (ref 150–400)
Platelets: 167 10*3/uL (ref 150–400)
Platelets: 146 10*3/uL — ABNORMAL LOW (ref 150–400)
RBC: 5.4 MIL/uL (ref 4.22–5.81)
RBC: 5.21 MIL/uL (ref 4.22–5.81)
RBC: 5.28 MIL/uL (ref 4.22–5.81)
RDW: 15.2 % (ref 11.5–15.5)
RDW: 15.4 % (ref 11.5–15.5)
RDW: 15.3 % (ref 11.5–15.5)
WBC: 21.5 10*3/uL — ABNORMAL HIGH (ref 4.0–10.5)
WBC: 19.8 10*3/uL — ABNORMAL HIGH (ref 4.0–10.5)
WBC: 13.6 10*3/uL — AB (ref 4.0–10.5)

## 2017-05-29 LAB — COMPREHENSIVE METABOLIC PANEL
ALBUMIN: 4.5 g/dL (ref 3.5–5.0)
ALK PHOS: 68 U/L (ref 38–126)
ALT: 19 U/L (ref 17–63)
AST: 36 U/L (ref 15–41)
Anion gap: 32 — ABNORMAL HIGH (ref 5–15)
BUN: 53 mg/dL — ABNORMAL HIGH (ref 6–20)
CALCIUM: 9.3 mg/dL (ref 8.9–10.3)
CO2: 20 mmol/L — AB (ref 22–32)
CREATININE: 13.28 mg/dL — AB (ref 0.61–1.24)
Chloride: 75 mmol/L — ABNORMAL LOW (ref 101–111)
GFR calc Af Amer: 5 mL/min — ABNORMAL LOW (ref >60)
GFR calc non Af Amer: 4 mL/min — ABNORMAL LOW (ref >60)
GLUCOSE: 185 mg/dL — AB (ref 65–99)
Potassium: 5.2 mmol/L — ABNORMAL HIGH (ref 3.5–5.1)
SODIUM: 127 mmol/L — AB (ref 135–145)
Total Bilirubin: 1 mg/dL (ref 0.3–1.2)
Total Protein: 9.6 g/dL — ABNORMAL HIGH (ref 6.5–8.1)

## 2017-05-29 LAB — LIPASE, BLOOD: LIPASE: 27 U/L (ref 11–51)

## 2017-05-29 MED ORDER — PROMETHAZINE HCL 25 MG/ML IJ SOLN
25.0000 mg | Freq: Four times a day (QID) | INTRAMUSCULAR | Status: DC | PRN
Start: 2017-05-29 — End: 2017-05-31
  Administered 2017-05-30 (×2): 25 mg via INTRAVENOUS
  Filled 2017-05-29 (×2): qty 1

## 2017-05-29 MED ORDER — SODIUM CHLORIDE 0.9 % IV SOLN: 100.0000 mL | INTRAVENOUS | Status: DC | PRN

## 2017-05-29 MED ORDER — ONDANSETRON HCL 4 MG/2ML IJ SOLN
4.0000 mg | Freq: Once | INTRAMUSCULAR | Status: AC | PRN
  Administered 2017-05-29: 4 mg via INTRAVENOUS
  Filled 2017-05-29: qty 2

## 2017-05-29 MED ORDER — CALCITRIOL 0.25 MCG PO CAPS: 0.2500 ug | ORAL_CAPSULE | ORAL | Status: DC

## 2017-05-29 MED ORDER — SODIUM CHLORIDE 0.9 % IV SOLN
INTRAVENOUS | Status: AC
  Administered 2017-05-30: 01:00:00 via INTRAVENOUS

## 2017-05-29 MED ORDER — IOPAMIDOL (ISOVUE-300) INJECTION 61%
INTRAVENOUS | Status: AC
  Administered 2017-05-29: 100 mL
  Filled 2017-05-29: qty 100

## 2017-05-29 MED ORDER — SODIUM CHLORIDE 0.9% FLUSH
3.0000 mL | Freq: Two times a day (BID) | INTRAVENOUS | Status: DC
  Administered 2017-05-29 – 2017-05-30 (×2): 3 mL via INTRAVENOUS

## 2017-05-29 MED ORDER — ALTEPLASE 2 MG IJ SOLR: 2.0000 mg | Freq: Once | INTRAMUSCULAR | Status: DC | PRN

## 2017-05-29 MED ORDER — SODIUM CHLORIDE 0.9 % IV SOLN: 250.0000 mL | INTRAVENOUS | Status: DC | PRN

## 2017-05-29 MED ORDER — RENA-VITE PO TABS
1.0000 | ORAL_TABLET | Freq: Every day | ORAL | Status: DC
  Administered 2017-05-29: 1 via ORAL
  Filled 2017-05-29 (×3): qty 1

## 2017-05-29 MED ORDER — MORPHINE SULFATE (PF) 4 MG/ML IV SOLN
4.0000 mg | Freq: Once | INTRAVENOUS | Status: AC
  Administered 2017-05-29: 4 mg via INTRAVENOUS
  Filled 2017-05-29: qty 1

## 2017-05-29 MED ORDER — CALCIUM ACETATE (PHOS BINDER) 667 MG PO CAPS
667.0000 mg | ORAL_CAPSULE | Freq: Three times a day (TID) | ORAL | Status: DC
  Administered 2017-05-30: 667 mg via ORAL
  Filled 2017-05-29 (×2): qty 1

## 2017-05-29 MED ORDER — LIDOCAINE-PRILOCAINE 2.5-2.5 % EX CREA: 1.0000 "application " | TOPICAL_CREAM | CUTANEOUS | Status: DC | PRN

## 2017-05-29 MED ORDER — PENTAFLUOROPROP-TETRAFLUOROETH EX AERO: 1.0000 "application " | INHALATION_SPRAY | CUTANEOUS | Status: DC | PRN

## 2017-05-29 MED ORDER — HYDROMORPHONE HCL 1 MG/ML IJ SOLN
INTRAMUSCULAR | Status: AC
  Filled 2017-05-29: qty 0.5

## 2017-05-29 MED ORDER — PANTOPRAZOLE SODIUM 40 MG IV SOLR
40.0000 mg | Freq: Two times a day (BID) | INTRAVENOUS | Status: DC
  Administered 2017-05-29 – 2017-05-30 (×3): 40 mg via INTRAVENOUS
  Filled 2017-05-29 (×4): qty 40

## 2017-05-29 MED ORDER — CALCITRIOL 0.25 MCG PO CAPS
ORAL_CAPSULE | ORAL | Status: AC
  Administered 2017-05-29: 0.25 ug via ORAL
  Filled 2017-05-29: qty 1

## 2017-05-29 MED ORDER — SODIUM CHLORIDE 0.9% FLUSH
3.0000 mL | Freq: Two times a day (BID) | INTRAVENOUS | Status: DC
  Administered 2017-05-30: 3 mL via INTRAVENOUS

## 2017-05-29 MED ORDER — NEPRO/CARBSTEADY PO LIQD
237.0000 mL | Freq: Three times a day (TID) | ORAL | Status: DC
  Filled 2017-05-29 (×6): qty 237

## 2017-05-29 MED ORDER — SODIUM CHLORIDE 0.9 % IV BOLUS (SEPSIS)
250.0000 mL | Freq: Once | INTRAVENOUS | Status: AC
  Administered 2017-05-29: 250 mL via INTRAVENOUS

## 2017-05-29 MED ORDER — PANTOPRAZOLE SODIUM 40 MG IV SOLR: 40.0000 mg | Freq: Once | INTRAVENOUS | Status: DC

## 2017-05-29 MED ORDER — PROMETHAZINE HCL 25 MG/ML IJ SOLN
25.0000 mg | Freq: Once | INTRAMUSCULAR | Status: AC
  Administered 2017-05-29: 25 mg via INTRAVENOUS
  Filled 2017-05-29: qty 1

## 2017-05-29 MED ORDER — CALCITRIOL 0.25 MCG PO CAPS
0.2500 ug | ORAL_CAPSULE | Freq: Once | ORAL | Status: AC
  Administered 2017-05-29: 0.25 ug via ORAL

## 2017-05-29 MED ORDER — SODIUM CHLORIDE 0.9% FLUSH: 3.0000 mL | INTRAVENOUS | Status: DC | PRN

## 2017-05-29 MED ORDER — PEG 3350-KCL-NA BICARB-NACL 420 G PO SOLR
4000.0000 mL | Freq: Once | ORAL | Status: AC
  Administered 2017-05-30: 4000 mL via ORAL
  Filled 2017-05-29: qty 4000

## 2017-05-29 MED ORDER — HEPARIN SODIUM (PORCINE) 1000 UNIT/ML DIALYSIS: 1000.0000 [IU] | INTRAMUSCULAR | Status: DC | PRN

## 2017-05-29 MED ORDER — HYDROMORPHONE HCL 1 MG/ML IJ SOLN
0.5000 mg | INTRAMUSCULAR | Status: DC | PRN
Start: 2017-05-29 — End: 2017-05-31
  Administered 2017-05-29 – 2017-05-30 (×8): 0.5 mg via INTRAVENOUS
  Filled 2017-05-29 (×2): qty 0.5
  Filled 2017-05-29: qty 1
  Filled 2017-05-29 (×4): qty 0.5

## 2017-05-29 MED ORDER — NEBIVOLOL HCL 10 MG PO TABS
10.0000 mg | ORAL_TABLET | Freq: Every day | ORAL | Status: DC
  Administered 2017-05-30: 10 mg via ORAL
  Filled 2017-05-29 (×3): qty 1

## 2017-05-29 MED ORDER — LIDOCAINE HCL (PF) 1 % IJ SOLN: 5.0000 mL | INTRAMUSCULAR | Status: DC | PRN

## 2017-05-29 NOTE — ED Triage Notes (Signed)
Pt arrives from home via [facility] reporting bright red blood in stools since Saturday.  Pt reports sharp, stabbing lower abd pain with n/v also since Saturday.  Pt endorses coffee ground emesis.  Pt reports last dialysis Saturday, reports finishing tx. Pt guarding, distress noted.

## 2017-05-29 NOTE — Consult Note (Signed)
Referring Provider:  Dr. Charlynn Grimes Primary Care Physician:  Edrick Oh, MD Primary Gastroenterologist:  Althia Forts  Reason for Consultation:  GI bleed  HPI: Curtis Rangel is a 41 y.o. male with past medical history of end-stage renal disease currently on dialysis, history of peripheral vascular disease pertinent to [facility] ED with chief complaint of abdominal pain and rectal bleeding. GI is consulted for further evaluation.  Patient seen and examined at bedside in the ED. According to patient he was doing fine after his dialysis on Saturday but then he developed a patient of low blood pressure. Subsequently started having nausea followed by coffee-ground emesis. He then had lower abdominal pain followed by bloody bowel movement. He described bright red blood as well as black color stool. He describes abdominal pain is sharp, bilateral lower quadrant, without any radiation. Denied any fever or chills. Had a episode of bloody bowel movement while in the ED.  He takes aspirin as needed for pain. He denied previous bleeding episodes. Denied previous EGD or colonoscopy. No family history of colon cancer or colon polyps.  Past Medical History:  Diagnosis Date  . Anemia   . Asthma   . 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 CAD (coronary artery disease)   . Dialysis patient ([place])   . Hypertension   . Kidney failure     Past Surgical History:  Procedure Laterality Date  . FISTULOGRAM Left 06/11/2014   Procedure: FISTULOGRAM;  Surgeon: Rosetta Posner, MD;  Location: [facility] CATH LAB;  Service: Cardiovascular;  Laterality: Left;  arm  . KIDNEY TRANSPLANT     failed transplant, originally done at [facility].   Marland Kitchen PERIPHERAL VASCULAR CATHETERIZATION N/A 08/15/2016   Procedure: A/V Fistulagram;  Surgeon: Angelia Mould, MD;  Location: [place] CV LAB;  Service: Cardiovascular;  Laterality: N/A;  . PERIPHERAL VASCULAR CATHETERIZATION  08/15/2016   Procedure: Peripheral  Vascular Balloon Angioplasty;  Surgeon: Angelia Mould, MD;  Location: [place] CV LAB;  Service: Cardiovascular;;    Prior to Admission medications   Medication Sig Start Date End Date Taking? Authorizing Provider  aspirin-acetaminophen-caffeine (EXCEDRIN MIGRAINE) [phone] MG tablet Take 2 tablets by mouth every 6 (six) hours as needed for headache.   Yes [provider]  calcium acetate (PHOSLO) 667 MG capsule Take 667-2,001 mg by mouth See admin instructions. Takes 2001 mg by mouth 3 times daily with every meal and takes 667 mg by mouth two times with each snack.   Yes [provider]  nebivolol (BYSTOLIC) 10 MG tablet Take 10 mg by mouth at bedtime.   Yes [provider]  amLODipine (NORVASC) 10 MG tablet Take 10 mg by mouth daily.    [provider]  fosinopril (MONOPRIL) 40 MG tablet Take 40 mg by mouth at bedtime.    [provider]  ibuprofen (ADVIL,MOTRIN) 200 MG tablet Take 400 mg by mouth every 6 (six) hours as needed for headache or moderate pain.     [provider]  labetalol (NORMODYNE) 200 MG tablet Take 200 mg by mouth 2 (two) times daily.    [provider]  lisinopril (PRINIVIL,ZESTRIL) 20 MG tablet Take 20 mg by mouth daily.    [provider]    Scheduled Meds: . pantoprazole (PROTONIX) IV  40 mg Intravenous BID   Continuous Infusions: . sodium chloride    . sodium chloride     PRN Meds:.HYDROmorphone (DILAUDID) injection, promethazine  Allergies as  of 05/14/2017  . (No Known Allergies)    Family History  Problem Relation Age of Onset  . Diabetes Mother   . Varicose Veins Mother   . Heart disease Father     Social History   Social History  . Marital status: Single    Spouse name: N/A  . Number of children: N/A  . Years of education: N/A   Occupational History  . Not on file.   Social History Main Topics  . Smoking status: Current Every Day Smoker    Packs/day: 1.00   . Smokeless tobacco: Never Used  . Alcohol use No  . Drug use: No  . Sexual activity: Not on file   Other Topics Concern  . Not on file   Social History Narrative  . No narrative on file    Review of Systems: Review of Systems  Constitutional: Negative for chills, fever, malaise/fatigue and weight loss.  HENT: Negative for hearing loss and tinnitus.   Eyes: Negative for blurred vision and double vision.  Respiratory: Negative for cough and hemoptysis.   Cardiovascular: Negative for chest pain and palpitations.  Gastrointestinal: Positive for abdominal pain, blood in stool, heartburn, melena, nausea and vomiting.  Genitourinary: Negative for dysuria and urgency.  Musculoskeletal: Negative for myalgias and neck pain.  Skin: Negative for itching and rash.  Neurological: Negative for seizures and loss of consciousness.  Endo/Heme/Allergies: Does not bruise/bleed easily.  Psychiatric/Behavioral: Negative for hallucinations and suicidal ideas.    Physical Exam: Vital signs: Vitals:   05/28/2017 1145 06/05/2017 1215  BP: (!) 154/108 (!) 157/111  Pulse: (!) 107 98  Resp: (!) 24 14  Temp:    SpO2: 100% 99%     Physical Exam  Constitutional: He is oriented to person, place, and time. He appears well-developed and well-nourished. No distress.  HENT:  Head: Normocephalic and atraumatic.  Mouth/Throat: Oropharynx is clear and moist.  Eyes: EOM are normal. No scleral icterus.  Neck: Normal range of motion. Neck supple. No thyromegaly present.  Cardiovascular: Normal rate, regular rhythm and normal heart sounds.   Pulmonary/Chest: Effort normal and breath sounds normal. No respiratory distress.  Abdominal: Soft. Bowel sounds are normal. He exhibits no distension. There is tenderness. There is no rebound and no guarding.  Bilateral lower quadrant tenderness on palpation  Musculoskeletal: Normal range of motion. He exhibits no edema.  Neurological: He is alert and oriented to person,  place, and time.  Skin: Skin is warm. No erythema.  Psychiatric: He has a normal mood and affect. His behavior is normal. Thought content normal.    GI:  Lab Results:  Recent Labs  06/04/2017 0901  WBC 21.5*  HGB 17.3*  HCT 50.4  PLT 187   BMET  Recent Labs  05/24/2017 0901  NA 127*  K 5.2*  CL 75*  CO2 20*  GLUCOSE 185*  BUN 53*  CREATININE 13.28*  CALCIUM 9.3   LFT  Recent Labs  05/27/2017 0901  PROT 9.6*  ALBUMIN 4.5  AST 36  ALT 19  ALKPHOS 68  BILITOT 1.0   PT/INR No results for input(s): LABPROT, INR in the last 72 hours.   Studies/Results: Ct Abdomen Pelvis W Contrast  Result Date: 06/07/2017 CLINICAL DATA:  Acute lower abdominal pain. EXAM: CT ABDOMEN AND PELVIS WITH CONTRAST TECHNIQUE: Multidetector CT imaging of the abdomen and pelvis was performed using the standard protocol following bolus administration of intravenous contrast. CONTRAST:  166mL ISOVUE-300 IOPAMIDOL (ISOVUE-300) INJECTION 61% COMPARISON:  CT scan  of Feb 03, 2012. FINDINGS: Lower chest: No acute abnormality. Hepatobiliary: No gallstones are noted. Fatty infiltration of the right hepatic lobe is noted. Pancreas: Unremarkable. No pancreatic ductal dilatation or surrounding inflammatory changes. Spleen: Normal in size without focal abnormality. Adrenals/Urinary Tract: Adrenal glands appear normal. Stable severe bilateral renal atrophy is noted with multiple cysts seen, consistent with history of end-stage renal disease. No hydronephrosis or renal obstruction is noted. Urinary bladder is unremarkable. Stomach/Bowel: Stomach is within normal limits. Appendix appears normal. No evidence of bowel wall thickening, distention, or inflammatory changes. Vascular/Lymphatic: Aortic atherosclerosis. No enlarged abdominal or pelvic lymph nodes. Reproductive: Prostate is unremarkable. Other: No abnormal fluid collection is noted. No hernia is noted. Calcification in scarring is seen in the right lower quadrant  most consistent with prior renal transplant. Musculoskeletal: Stable sclerotic densities are noted in both femoral heads suggesting early avascular necrosis. IMPRESSION: Fatty infiltration of the liver. Severe bilateral renal atrophy is noted with multiple cysts seen, consistent with history of end-stage renal disease. Aortic atherosclerosis. Scarring and calcification seen in right lower quadrant of abdomen most consistent with prior renal transplant. 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 Electronically Signed   By: Marijo Conception, M.D.   On: 05/28/2017 10:44    Impression/Plan: - Coffee-ground emesis as well as bright red blood per rectum. D/D Ulcer disease vs AVMs vs Ischemic event given his episode of low blood pressure and Saturday. - End-stage renal disease. On hemodialysis - History of peripheral vascular disease  Recommendations -------------------------- - Patient's hemoglobin is 17.3 which is probably from hemoconcentration. - CT abdomen pelvis showed no acute changes to explain his GI bleed. - Continue IV twice a day PPI for now. Avoid NSAIDs. - Recommend EGD and colonoscopy tomorrow after dialysis for further evaluation. Risk benefits alternatives discussed with the patient. He verbalized understanding. Further plan based on procedure findings.    LOS: 0 days   Otis Brace  MD, FACP 05/24/2017, 12:30 PM  Pager [phone] If no answer or after 5 PM call [phone]

## 2017-05-29 NOTE — ED Provider Notes (Signed)
[facility] DEPT Provider Note   CSN: [id number] Arrival date & time: 06/07/2017  0848     History   Chief Complaint Chief Complaint  Patient presents with  . Rectal Bleeding  . Abdominal Pain    HPI Curtis Rangel is a 41 y.o. male.  HPI Patient presents to the emergency department with nausea, vomiting with rectal bleeding.  The patient states that started on Saturday.  He states that he also has vomited some thick, reddish-looking material as well.  Patient states she has had some hemorrhoids in the past, but never had bleeding like this.  Patient states that his dialysis session on Saturday, did not have any abnormalities or complications.  Patient states that his blood pressure and very low Saturday, he started feeling worse today and called EMS.  The patient states he also has lower abdominal discomfort The patient denies chest pain, shortness of breath, headache,blurred vision, neck pain, fever, cough, weakness, numbness, dizziness, anorexia, edema,  diarrhea, rash, back pain, near syncope, or syncope. Past Medical History:  Diagnosis Date  . Anemia   . Asthma   . 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 CAD (coronary artery disease)   . Dialysis patient ([place])   . Hypertension   . Kidney failure     Patient Active Problem List   Diagnosis Date Noted  . Hematochezia 06/11/2017  . Mechanical complication of other vascular device, implant, and graft 06/03/2014  . Kidney failure   . Dialysis patient ([place])   . GOODPASTURES SYNDROME 10/16/2009  . HYPERLIPIDEMIA 10/15/2009  . HYPERTENSION 10/15/2009  . ESRD 10/15/2009    Past Surgical History:  Procedure Laterality Date  . FISTULOGRAM Left 06/11/2014   Procedure: FISTULOGRAM;  Surgeon: Rosetta Posner, MD;  Location: [facility] CATH LAB;  Service: Cardiovascular;  Laterality: Left;  arm  . KIDNEY TRANSPLANT     failed transplant, originally done at [facility].   Marland Kitchen PERIPHERAL VASCULAR CATHETERIZATION N/A  08/15/2016   Procedure: A/V Fistulagram;  Surgeon: Angelia Mould, MD;  Location: [facility] CV LAB;  Service: Cardiovascular;  Laterality: N/A;  . PERIPHERAL VASCULAR CATHETERIZATION  08/15/2016   Procedure: Peripheral Vascular Balloon Angioplasty;  Surgeon: Angelia Mould, MD;  Location: [facility] CV LAB;  Service: Cardiovascular;;       Home Medications    Prior to Admission medications   Medication Sig Start Date End Date Taking? Authorizing Provider  aspirin-acetaminophen-caffeine (EXCEDRIN MIGRAINE) [phone] MG tablet Take 2 tablets by mouth every 6 (six) hours as needed for headache.   Yes [provider]  calcium acetate (PHOSLO) 667 MG capsule Take 667-2,001 mg by mouth See admin instructions. Takes 2001 mg by mouth 3 times daily with every meal and takes 667 mg by mouth two times with each snack.   Yes [provider]  nebivolol (BYSTOLIC) 10 MG tablet Take 10 mg by mouth at bedtime.   Yes [provider]  amLODipine (NORVASC) 10 MG tablet Take 10 mg by mouth daily.    [provider]  fosinopril (MONOPRIL) 40 MG tablet Take 40 mg by mouth at bedtime.    [provider]  ibuprofen (ADVIL,MOTRIN) 200 MG tablet Take 400 mg by mouth every 6 (six) hours as needed for headache or moderate pain.     [provider]  labetalol (NORMODYNE) 200 MG tablet Take 200 mg by mouth 2 (two) times daily.    [provider]  lisinopril (PRINIVIL,ZESTRIL) 20  MG tablet Take 20 mg by mouth daily.    [provider]    Family History Family History  Problem Relation Age of Onset  . Diabetes Mother   . Varicose Veins Mother   . Heart disease Father     Social History Social History  Substance Use Topics  . Smoking status: Current Every Day Smoker    Packs/day: 1.00  . Smokeless tobacco: Never Used  . Alcohol use No     Allergies   Patient has no known allergies.   Review of Systems Review of  Systems All other systems negative except as documented in the HPI. All pertinent positives and negatives as reviewed in the HPI.  Physical Exam Updated Vital Signs BP (!) 152/104   Pulse 97   Temp 97.6 F (36.4 C) (Oral)   Resp (!) 23   Ht 6\' 2"  (1.88 m)   Wt 72.6 kg (160 lb)   SpO2 96%   BMI 20.54 kg/m   Physical Exam  Constitutional: He is oriented to person, place, and time. He appears well-developed and well-nourished. No distress.  HENT:  Head: Normocephalic and atraumatic.  Mouth/Throat: Oropharynx is clear and moist.  Eyes: Pupils are equal, round, and reactive to light.  Neck: Normal range of motion. Neck supple.  Cardiovascular: Normal rate, regular rhythm and normal heart sounds.  Exam reveals no gallop and no friction rub.   No murmur heard. Pulmonary/Chest: Effort normal and breath sounds normal. No respiratory distress. He has no wheezes.  Abdominal: Soft. Bowel sounds are normal. He exhibits no distension and no mass. There is tenderness. There is no rebound and no guarding.    Neurological: He is alert and oriented to person, place, and time. He exhibits normal muscle tone. Coordination normal.  Skin: Skin is warm and dry. Capillary refill takes less than 2 seconds. No rash noted. No erythema.  Psychiatric: He has a normal mood and affect. His behavior is normal.  Nursing note and vitals reviewed.    ED Treatments / Results  Labs (all labs ordered are listed, but only abnormal results are displayed) Labs Reviewed  COMPREHENSIVE METABOLIC PANEL - Abnormal; Notable for the following:       Result Value   Sodium 127 (*)    Potassium 5.2 (*)    Chloride 75 (*)    CO2 20 (*)    Glucose, Bld 185 (*)    BUN 53 (*)    Creatinine, Ser 13.28 (*)    Total Protein 9.6 (*)    GFR calc non Af Amer 4 (*)    GFR calc Af Amer 5 (*)    Anion gap 32 (*)    All other components within normal limits  CBC - Abnormal; Notable for the following:    WBC 21.5 (*)     Hemoglobin 17.3 (*)    All other components within normal limits  LIPASE, BLOOD    EKG  EKG Interpretation  Date/Time:  Monday May 29 2017 10:29:59 EDT Ventricular Rate:  104 PR Interval:    QRS Duration: 116 QT Interval:  372 QTC Calculation: 490 R Axis:   109 Text Interpretation:  Sinus tachycardia Biatrial enlargement Nonspecific intraventricular conduction delay Anteroseptal infarct, old No STEMI.  Confirmed by Nanda Quinton [phone]) on 06/08/2017 10:34:21 AM       Radiology Ct Abdomen Pelvis W Contrast  Result Date: 06/03/2017 CLINICAL DATA:  Acute lower abdominal pain. EXAM: CT ABDOMEN AND PELVIS WITH CONTRAST TECHNIQUE: Multidetector CT  imaging of the abdomen and pelvis was performed using the standard protocol following bolus administration of intravenous contrast. CONTRAST:  138mL ISOVUE-300 IOPAMIDOL (ISOVUE-300) INJECTION 61% COMPARISON:  CT scan of Feb 03, 2012. FINDINGS: Lower chest: No acute abnormality. Hepatobiliary: No gallstones are noted. Fatty infiltration of the right hepatic lobe is noted. Pancreas: Unremarkable. No pancreatic ductal dilatation or surrounding inflammatory changes. Spleen: Normal in size without focal abnormality. Adrenals/Urinary Tract: Adrenal glands appear normal. Stable severe bilateral renal atrophy is noted with multiple cysts seen, consistent with history of end-stage renal disease. No hydronephrosis or renal obstruction is noted. Urinary bladder is unremarkable. Stomach/Bowel: Stomach is within normal limits. Appendix appears normal. No evidence of bowel wall thickening, distention, or inflammatory changes. Vascular/Lymphatic: Aortic atherosclerosis. No enlarged abdominal or pelvic lymph nodes. Reproductive: Prostate is unremarkable. Other: No abnormal fluid collection is noted. No hernia is noted. Calcification in scarring is seen in the right lower quadrant most consistent with prior renal transplant. Musculoskeletal: Stable sclerotic  densities are noted in both femoral heads suggesting early avascular necrosis. IMPRESSION: Fatty infiltration of the liver. Severe bilateral renal atrophy is noted with multiple cysts seen, consistent with history of end-stage renal disease. Aortic atherosclerosis. Scarring and calcification seen in right lower quadrant of abdomen most consistent with prior renal transplant. 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 Electronically Signed   By: Marijo Conception, M.D.   On: 05/28/2017 10:44    Procedures Procedures (including critical care time)  Medications Ordered in ED Medications  pantoprazole (PROTONIX) injection 40 mg (not administered)  ondansetron (ZOFRAN) injection 4 mg (4 mg Intravenous Given 06/02/2017 0911)  promethazine (PHENERGAN) injection 25 mg (25 mg Intravenous Given 05/16/2017 0953)  iopamidol (ISOVUE-300) 61 % injection (100 mLs  Contrast Given 05/22/2017 1014)  morphine 4 MG/ML injection 4 mg (4 mg Intravenous Given 05/19/2017 1055)     Initial Impression / Assessment and Plan / ED Course  I have reviewed the triage vital signs and the nursing notes.  Pertinent labs & imaging results that were available during my care of the patient were reviewed by me and considered in my medical decision making (see chart for details).     Patient will need admission to the hospital for further evaluation and care due to his complex medical history and dehydration.  Patient also will need this bleeding source properly identified.  Patient did have a bloody bowel movement, but very little stool associated with it.  Here in the emergency department  Final Clinical Impressions(s) / ED Diagnoses   Final diagnoses:  Rectal bleeding  Dehydration    New Prescriptions New Prescriptions   No medications on file     Dalia Heading, Hershal Coria 05/15/2017 1151    Long, Wonda Olds, MD 06/07/2017 1901

## 2017-05-29 NOTE — H&P (Signed)
Date: 05/17/2017               Patient Name:  Curtis Rangel MRN: [id number]  DOB: April 08, 1976 Age / Sex: 41 y.o., male   PCP: Edrick Oh, MD         Medical Service: Internal Medicine Teaching Service         Attending Physician: Dr. Bartholomew Crews, MD    First Contact: Dr. Frederico Hamman Pager: [phone]  Second Contact: Dr. 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 He reports that he was in his normal state of health until after his dialysis session on Saturday. He says that he had an episode of hypotension during dialysis. After going home me starting having nausea and vomiting with coffee ground emesis. He subsequently developed diffuse lower abdominal pain and had a bloody bowel movement. He reports grossly bloody stools both in the toilet bowel and on his stools. His abdominal pain is sharp in nature in his bilateral lower quadrants without any radiation. He denies any chest pain, shortness of breath, weakness, dizziness, diarrhea, fever, or chills. Reports using Excedrin migraine for headaches occasionally, last dose Friday. Does report having an episode of rectal bleeding in the past but reports at that time he only noticed blood on the toilet paper; reports he was told it was hemorrhoids at that time. Denies any previous EGD or colonoscopy. Denies any family history of colon cancer.  In the ED, he was found to be tachycardic but otherwise hemodynamically stable. Hgb was noted to be 17.3 but likely hemo-concentrated. CT abdomen/pelivs with contrast was obtained that did not  show any acute changes to explain his abdominal pain or GI bleed. IMTS was called for admission.   Meds:  Current Meds  Medication Sig  . aspirin-acetaminophen-caffeine (EXCEDRIN MIGRAINE) 250-250-65 MG tablet Take 2 tablets by mouth every 6 (six) hours as needed for headache.  . calcium acetate (PHOSLO) 667 MG capsule Take 667-2,001 mg by mouth See admin instructions. Takes 2001 mg by mouth 3 times daily with every meal and takes 667 mg by mouth two times with each snack.  . nebivolol (BYSTOLIC) 10 MG tablet Take 10 mg by mouth at bedtime.   Allergies: Allergies as of 06/07/2017  . (No Known Allergies)   Past Medical History:  Diagnosis Date  . Anemia   . Asthma   . 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 CAD (coronary artery disease)   . Dialysis patient ([place])   . Hypertension   . Kidney failure    Past Surgical History:  Procedure Laterality Date  . FISTULOGRAM Left 06/11/2014   Procedure: FISTULOGRAM;  Surgeon: Rosetta Posner, MD;  Location: [facility] CATH LAB;  Service: Cardiovascular;  Laterality: Left;  arm  . KIDNEY TRANSPLANT     failed transplant, originally done at [facility].   Marland Kitchen PERIPHERAL VASCULAR CATHETERIZATION N/A 08/15/2016   Procedure: A/V Fistulagram;  Surgeon: Angelia Mould, MD;  Location: [facility] CV LAB;  Service: Cardiovascular;  Laterality: N/A;  . PERIPHERAL VASCULAR CATHETERIZATION  08/15/2016   Procedure: Peripheral Vascular Balloon Angioplasty;  Surgeon: Angelia Mould, MD;  Location: [facility] CV LAB;  Service: Cardiovascular;;   Family History:  Family History  Problem Relation Age of Onset  . Diabetes Mother   . Varicose Veins Mother   . Heart disease Father    Social History:  Social History   Social History  . Marital status: Single    Spouse name: N/A  . Number of children: N/A  . Years of education: N/A   Social History Main Topics  . Smoking status: Current Every Day Smoker    Packs/day: 1.00  .  Smokeless tobacco: Never Used  . Alcohol use No  . Drug use: No  . Sexual activity: Not Asked   Other Topics Concern  . None   Social History Narrative  . None   Review of Systems: A complete ROS was negative except as per HPI.   Physical Exam: Blood pressure (!) 157/111, pulse 98, temperature 97.6 F (36.4 C), temperature source Oral, resp. rate 14, height 6\' 2"  (1.88 m), weight 160 lb (72.6 kg), SpO2 99 %. General: alert, well-developed, and cooperative to examination.  Head: normocephalic and atraumatic.  Eyes: vision grossly intact, pupils equal, pupils round, pupils reactive to light, no injection and anicteric.  Mouth: dry mucous membranes, no erythema, and no exudates.  Neck: supple, full ROM, no thyromegaly, no JVD, and no carotid bruits.  Lungs: normal respiratory effort, no accessory muscle use, normal breath sounds, no crackles, and no wheezes. Heart: tachycardic, regular rhythm, no murmur, no gallop, and no rub.  Abdomen: soft, tenderness to palpation over RLQ, LLQ, and suprapubic regions, decreased bowel sounds, no distention, no guarding, no rebound tenderness Msk: no joint swelling, no joint warmth, and no redness over joints.  Pulses: 2+ DP/PT pulses bilaterally Extremities: No cyanosis, clubbing, edema Neurologic: alert & oriented X3, no focal deficits Skin: turgor normal and no rashes.  Psych: normal mood and affect  EKG: personally reviewed my interpretation is sinus tachycarida  Assessment & Plan by Problem:  GI Bleed: Patient presenting with 3 day history of coffee ground emesis and bright red blood per rectum. Patient does report taking occasional ASA (500 mg/dose) for headaches with last dose Friday. He also notes that he had an episode of hypotension during his dialysis session on Saturday prior to the start of his symptoms. Lab work shows his BUN in line with previous and not elevated out of proportion to his creatinine. His Hgb is 17.3 but likely  hemo-concentrated 2/2 nausea and vomiting. Differential includes peptic ulcer disease, AVMs vs ischemic event. He does appear clinically dry on exam as well on lab work. Will give 250 mL bolus in ED and then give gentle IVF. Started Protonix IV bid for possible upper GI bleed. Consulted GI who was the patient in the ED, will plan for EDG and colonoscopy tomorrow after dialysis. Clear liquid diet today, NPO at MN. Will follow serial CBCs.   ESRD 2/2 Goodpasture Syndrome with failed renal transplant: On dialysis T,Th,S. Completed last dialysis session. Will consult nephrology for his regular dialysis session tomorrow. Reports dry weight is 160 lbs.   HTN: Holding home antihypertensives in setting of GI bleed  Code: Full  Dispo: Admit patient to Inpatient with expected length of stay greater than 2 midnights.  Signed: Maryellen Pile, MD 06/08/2017, 1:13 PM  Pager: [phone]

## 2017-05-29 NOTE — Consult Note (Signed)
[facility] Renal Consultation Note    Indication for Consultation:  Management of ESRD/hemodialysis; anemia, hypertension/volume and secondary hyperparathyroidism  HPI: Curtis Rangel is a 41 y.o. male with ESRD 2/2 Goodpasture's Syndrome, s/p renal transplant 2006, that failed in 2011. On HD TTS, at [facility], since 09/2009.  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 Patient states he has been tolerating dialysis well; of note, he has chronically large gains and does not always adhere to prescribed treatment.  Curtis Rangel presented to the ED with 2 day complaint of lower abdominal pain, BRBPR and vomiting.  He reports a normal dialysis session on Saturday, without complications, which was followed by an episode of hypotension. That evening he developed nausea, and vomiting of reddish-brown substance, as well as sharp lower abdominal pain followed by bloody BM. He reports 2-3 episodes/day of n/v and reports a bloody BM in the ED.  Admits to similar abdominal pain in the past, but never for this long of a period of time; decreased appetite, anorexia due to N/V.  Denies SOB, chest pain, melena, diarrhea, fever, chills, cough, dizziness, weakness, edema, syncope.  He admits to regularly taking Excedrin migraine for headaches, and has not taken his BP meds since Saturday.  Patient was seen and examined at bedside in ED.  Pertinent labs drawn in the ED include Hgb 17.3>16.1, WBC 21.5>19.8, and K 5.2.  CT abdomen (9/17) indicates no acute abnormalities. Patient admitted for further workup and evaluation of GI bleed.     Past Medical History:  Diagnosis Date  . Anemia   . Asthma   . 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 CAD (coronary artery disease)   . Dialysis patient ([place])   . Hypertension   . Kidney failure     Past Surgical History:  Procedure Laterality Date  . FISTULOGRAM Left 06/11/2014   Procedure: FISTULOGRAM;  Surgeon: Rosetta Posner, MD;  Location: [facility] CATH LAB;  Service: Cardiovascular;  Laterality: Left;  arm  . KIDNEY TRANSPLANT     failed transplant, originally done at [facility].   Marland Kitchen PERIPHERAL VASCULAR CATHETERIZATION N/A 08/15/2016   Procedure: A/V Fistulagram;  Surgeon: Angelia Mould, MD;  Location: [place] CV LAB;  Service: Cardiovascular;  Laterality: N/A;  . PERIPHERAL VASCULAR CATHETERIZATION  08/15/2016   Procedure: Peripheral Vascular Balloon Angioplasty;  Surgeon: Angelia Mould, MD;  Location: [place] CV LAB;  Service: Cardiovascular;;   Family History  Problem Relation Age of Onset  . Diabetes Mother   . Varicose Veins Mother   . Heart disease Father    Social History:  reports that he has been smoking.  He has been smoking about 1.00 pack per day. He has never used smokeless tobacco. He reports that he does not drink alcohol or use drugs. No Known Allergies Prior to Admission medications   Medication Sig Start Date End Date Taking? Authorizing Provider  aspirin-acetaminophen-caffeine (EXCEDRIN MIGRAINE) [phone] MG tablet Take 2 tablets by mouth every 6 (six) hours as needed for headache.   Yes [provider]  calcium acetate (PHOSLO) 667 MG capsule Take 667-2,001 mg by mouth See admin instructions. Takes 2001 mg by mouth 3 times daily with every meal and takes 667 mg by mouth two times with each snack.   Yes [provider]  nebivolol (BYSTOLIC) 10 MG tablet Take 10 mg by mouth  at bedtime.   Yes [provider]  amLODipine (NORVASC) 10 MG tablet Take 10 mg by mouth daily.    [provider]  fosinopril (MONOPRIL) 40 MG tablet Take 40 mg by mouth at bedtime.    [provider]  ibuprofen (ADVIL,MOTRIN) 200 MG tablet Take 400 mg by mouth every 6 (six) hours as needed for headache or moderate  pain.     [provider]  labetalol (NORMODYNE) 200 MG tablet Take 200 mg by mouth 2 (two) times daily.    [provider]  lisinopril (PRINIVIL,ZESTRIL) 20 MG tablet Take 20 mg by mouth daily.    [provider]   Current Facility-Administered Medications  Medication Dose Route Frequency Provider Last Rate Last Dose  . 0.9 %  sodium chloride infusion   Intravenous Continuous Maryellen Pile, MD      . calcitRIOL (ROCALTROL) capsule 0.25 mcg  0.25 mcg Oral Once in dialysis Alric Seton, PA-C      . [START ON 06/01/2017] calcitRIOL (ROCALTROL) capsule 0.25 mcg  0.25 mcg Oral Q T,Th,Sa-HD Alric Seton, PA-C      . HYDROmorphone (DILAUDID) injection 0.5 mg  0.5 mg Intravenous Q2H PRN Maryellen Pile, MD   0.5 mg at 05/22/2017 1433  . pantoprazole (PROTONIX) injection 40 mg  40 mg Intravenous BID Maryellen Pile, MD   40 mg at 05/24/2017 1453  . polyethylene glycol-electrolytes (NuLYTELY/GoLYTELY) solution 4,000 mL  4,000 mL Oral Once Brahmbhatt, Parag, MD      . promethazine (PHENERGAN) injection 25 mg  25 mg Intravenous Q6H PRN Maryellen Pile, MD       Current Outpatient Prescriptions  Medication Sig Dispense Refill  . aspirin-acetaminophen-caffeine (EXCEDRIN MIGRAINE) 250-250-65 MG tablet Take 2 tablets by mouth every 6 (six) hours as needed for headache.    . calcium acetate (PHOSLO) 667 MG capsule Take 667-2,001 mg by mouth See admin instructions. Takes 2001 mg by mouth 3 times daily with every meal and takes 667 mg by mouth two times with each snack.    . nebivolol (BYSTOLIC) 10 MG tablet Take 10 mg by mouth at bedtime.    Marland Kitchen amLODipine (NORVASC) 10 MG tablet Take 10 mg by mouth daily.    . fosinopril (MONOPRIL) 40 MG tablet Take 40 mg by mouth at bedtime.    Marland Kitchen ibuprofen (ADVIL,MOTRIN) 200 MG tablet Take 400 mg by mouth every 6 (six) hours as needed for headache or moderate pain.     Marland Kitchen labetalol (NORMODYNE) 200 MG tablet Take 200 mg by mouth 2 (two) times daily.     Marland Kitchen lisinopril (PRINIVIL,ZESTRIL) 20 MG tablet Take 20 mg by mouth daily.     Labs: Basic Metabolic Panel:  Recent Labs Lab 05/20/2017 0901  NA 127*  K 5.2*  CL 75*  CO2 20*  GLUCOSE 185*  BUN 53*  CREATININE 13.28*  CALCIUM 9.3   Liver Function Tests:  Recent Labs Lab 05/15/2017 0901  AST 36  ALT 19  ALKPHOS 68  BILITOT 1.0  PROT 9.6*  ALBUMIN 4.5    Recent Labs Lab 06/11/2017 0901  LIPASE 27   No results for input(s): AMMONIA in the last 168 hours. CBC:  Recent Labs Lab 05/18/2017 0901 06/09/2017 1245  WBC 21.5* 19.8*  HGB 17.3* 16.1  HCT 50.4 48.3  MCV 93.3 92.7  PLT 187 167   Cardiac Enzymes: No results for input(s): CKTOTAL, CKMB, CKMBINDEX, TROPONINI in the last 168 hours. CBG: No results for input(s): GLUCAP in the last  168 hours. Iron Studies: No results for input(s): IRON, TIBC, TRANSFERRIN, FERRITIN in the last 72 hours. Studies/Results: Ct Abdomen Pelvis W Contrast  Result Date: 05/28/2017 CLINICAL DATA:  Acute lower abdominal pain. EXAM: CT ABDOMEN AND PELVIS WITH CONTRAST TECHNIQUE: Multidetector CT imaging of the abdomen and pelvis was performed using the standard protocol following bolus administration of intravenous contrast. CONTRAST:  130mL ISOVUE-300 IOPAMIDOL (ISOVUE-300) INJECTION 61% COMPARISON:  CT scan of Feb 03, 2012. FINDINGS: Lower chest: No acute abnormality. Hepatobiliary: No gallstones are noted. Fatty infiltration of the right hepatic lobe is noted. Pancreas: Unremarkable. No pancreatic ductal dilatation or surrounding inflammatory changes. Spleen: Normal in size without focal abnormality. Adrenals/Urinary Tract: Adrenal glands appear normal. Stable severe bilateral renal atrophy is noted with multiple cysts seen, consistent with history of end-stage renal disease. No hydronephrosis or renal obstruction is noted. Urinary bladder is unremarkable. Stomach/Bowel: Stomach is within normal limits. Appendix appears normal. No evidence of bowel  wall thickening, distention, or inflammatory changes. Vascular/Lymphatic: Aortic atherosclerosis. No enlarged abdominal or pelvic lymph nodes. Reproductive: Prostate is unremarkable. Other: No abnormal fluid collection is noted. No hernia is noted. Calcification in scarring is seen in the right lower quadrant most consistent with prior renal transplant. Musculoskeletal: Stable sclerotic densities are noted in both femoral heads suggesting early avascular necrosis. IMPRESSION: Fatty infiltration of the liver. Severe bilateral renal atrophy is noted with multiple cysts seen, consistent with history of end-stage renal disease. Aortic atherosclerosis. Scarring and calcification seen in right lower quadrant of abdomen most consistent with prior renal transplant. 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 Electronically Signed   By: Marijo Conception, M.D.   On: 05/18/2017 10:44    ROS: As per HPI otherwise negative.  Physical Exam: Vitals:   05/21/2017 1145 05/16/2017 1215 06/08/2017 1516 05/21/2017 1533  BP: (!) 154/108 (!) 157/111  (!) 165/100  Pulse: (!) 107 98 100 (!) 111  Resp: (!) 24 14 20  (!) 22  Temp:      TempSrc:      SpO2: 100% 99%  96%  Weight:      Height:         General: WDWN, NAD Head: NCAT, sclera not icteric, scleral erythema b/l. Mucus membranes dry. Neck: Supple. No lymphadenopathy. Lungs: CTA bilaterally without wheezes, rales, or rhonchi. Breathing is unlabored. Heart: tachycardia, regular rhythm, with S1 S2. No murmur, rub or gallop Abdomen: soft, non-distended, no guarding, hypoactive bowel sounds, +tenderness in all 4 quadrants. Lower extremities:without edema or ischemic changes, no open wounds. 2+ PT pulses B/L Neuro: A & O  X 3. Moves all extremities spontaneously. Psych:  Responds to questions appropriately with a normal affect. Dialysis Access: LU AVF, +bruit, +thrill  Dialysis Orders: TTS, 4,25hrs, 180NRe Optiflus, BFR 500, DFR Autoflow  1.5, EDW 69.5, Dialysate 2K, 3.5Ca, 1Mg . UFR profile 4.  Last intervention LU AVF - 03-50% outflow basilic swing stenosis 0/9/38 - Dr. Augustin Coupe, CKV Last Labs in HD: 9/6 - Hgb 14.8, 8/23 - K 4.5, Ca 8.6, P 8.5, PTH 143, TSAT 43%, WBC 5.68  Assessment/Plan: 1.  GI Bleed 2/2 PUD vs AVMs vs Ischemic bowel vs Inflammatory process  -Hgb 16.1 - likely hemoconcentrated, continue serial Hgb.  -EGD and colonoscopy scheduled for tomorrow.  -IV Protonix BID 2. Leukocytosis -Afebrile, no obvious source, outpatient labs on 8/23 5.68  -CBC w/Diff in AM  -WBC falling in ED 21.5>19.8, going to follow trend and pursue additional workup if warranted. 3. ESRD - 2/2  Goodpasture Syndrome, s/p failed renal transplant, on HD TTS at [facility].  -Inpatient dialysis to be completed today due to elevated K of 5.2. Then resume regular schedule on Thursday.   -EDW 69.5kg, standing weights pre and post HD.  - RFP and CBC pre HD, if not already ordered. 4.  Hypertension/volume    -BP elevated, start Bystolic 10mg  qhs after dialysis today.  -Appears dry on exam, 29mL/hr x 1 day started in ED, monitor closely. 5.  Anemia    - Last Hgb in HD 14.8 on 9/6, Hgb 16.1 today, likely hemoconcentrated due to N/V.  -Continue daily CBC. 6.  Metabolic bone disease   -Ca today 9.3, within normal range.  -hyperphosphatemia on last labs in HD. Start outpatient binder- 4 Phoslo TID ac.  -Last PTH within normal range.  -Continue Calcitriol 0.60mcg 2x wk 7.  Nutrition   -Renal diet  -Renavite qd and Nepro TID before meals  [employer], PA-C 06/06/2017, 4:11 PM   Pt seen, examined and agree w A/P as above.  Kelly Splinter MD [employer] pager [phone]   2017-06-16, 8:31 AM

## 2017-05-29 NOTE — ED Notes (Signed)
ED Provider at bedside. 

## 2017-05-30 ENCOUNTER — Encounter (HOSPITAL_COMMUNITY): Payer: Self-pay | Admitting: Certified Registered Nurse Anesthetist

## 2017-05-30 ENCOUNTER — Encounter (HOSPITAL_COMMUNITY): Admission: EM | Disposition: E | Payer: Self-pay | Source: Home / Self Care | Attending: Internal Medicine

## 2017-05-30 ENCOUNTER — Encounter (HOSPITAL_COMMUNITY): Payer: Self-pay

## 2017-05-30 DIAGNOSIS — Z992 Dependence on renal dialysis: Secondary | ICD-10-CM

## 2017-05-30 DIAGNOSIS — I120 Hypertensive chronic kidney disease with stage 5 chronic kidney disease or end stage renal disease: Secondary | ICD-10-CM

## 2017-05-30 DIAGNOSIS — K625 Hemorrhage of anus and rectum: Secondary | ICD-10-CM

## 2017-05-30 DIAGNOSIS — N186 End stage renal disease: Secondary | ICD-10-CM

## 2017-05-30 DIAGNOSIS — T8612 Kidney transplant failure: Secondary | ICD-10-CM

## 2017-05-30 DIAGNOSIS — M310 Hypersensitivity angiitis: Secondary | ICD-10-CM

## 2017-05-30 DIAGNOSIS — R1032 Left lower quadrant pain: Secondary | ICD-10-CM

## 2017-05-30 DIAGNOSIS — R1031 Right lower quadrant pain: Secondary | ICD-10-CM

## 2017-05-30 DIAGNOSIS — R110 Nausea: Secondary | ICD-10-CM

## 2017-05-30 HISTORY — PX: FLEXIBLE SIGMOIDOSCOPY: SHX5431

## 2017-05-30 LAB — MRSA PCR SCREENING: MRSA BY PCR: POSITIVE — AB

## 2017-05-30 LAB — CBC WITH DIFFERENTIAL/PLATELET
BASOS PCT: 0 %
BLASTS: 0 %
Band Neutrophils: 43 %
Basophils Absolute: 0 10*3/uL (ref 0.0–0.1)
Eosinophils Absolute: 0 10*3/uL (ref 0.0–0.7)
Eosinophils Relative: 0 %
HCT: 45.3 % (ref 39.0–52.0)
HEMOGLOBIN: 15.2 g/dL (ref 13.0–17.0)
Lymphocytes Relative: 7 %
Lymphs Abs: 0.7 10*3/uL (ref 0.7–4.0)
MCH: 31.1 pg (ref 26.0–34.0)
MCHC: 33.6 g/dL (ref 30.0–36.0)
MCV: 92.8 fL (ref 78.0–100.0)
MONO ABS: 0.1 10*3/uL (ref 0.1–1.0)
MYELOCYTES: 0 %
Metamyelocytes Relative: 10 %
Monocytes Relative: 1 %
NEUTROS PCT: 39 %
NRBC: 0 /100{WBCs}
Neutro Abs: 8.7 10*3/uL — ABNORMAL HIGH (ref 1.7–7.7)
Other: 0 %
PLATELETS: 135 10*3/uL — AB (ref 150–400)
PROMYELOCYTES ABS: 0 %
RBC: 4.88 MIL/uL (ref 4.22–5.81)
RDW: 15.3 % (ref 11.5–15.5)
WBC MORPHOLOGY: INCREASED
WBC: 9.5 10*3/uL (ref 4.0–10.5)

## 2017-05-30 LAB — BASIC METABOLIC PANEL
Anion gap: 18 — ABNORMAL HIGH (ref 5–15)
BUN: 43 mg/dL — ABNORMAL HIGH (ref 6–20)
CO2: 24 mmol/L (ref 22–32)
Calcium: 8.3 mg/dL — ABNORMAL LOW (ref 8.9–10.3)
Chloride: 85 mmol/L — ABNORMAL LOW (ref 101–111)
Creatinine, Ser: 9.78 mg/dL — ABNORMAL HIGH (ref 0.61–1.24)
GFR calc Af Amer: 7 mL/min — ABNORMAL LOW (ref >60)
GFR calc non Af Amer: 6 mL/min — ABNORMAL LOW (ref >60)
Glucose, Bld: 119 mg/dL — ABNORMAL HIGH (ref 65–99)
Potassium: 5.4 mmol/L — ABNORMAL HIGH (ref 3.5–5.1)
Sodium: 127 mmol/L — ABNORMAL LOW (ref 135–145)

## 2017-05-30 LAB — RENAL FUNCTION PANEL
Albumin: 3.3 g/dL — ABNORMAL LOW (ref 3.5–5.0)
Anion gap: 14 (ref 5–15)
BUN: 39 mg/dL — AB (ref 6–20)
CHLORIDE: 87 mmol/L — AB (ref 101–111)
CO2: 28 mmol/L (ref 22–32)
CREATININE: 8.93 mg/dL — AB (ref 0.61–1.24)
Calcium: 8.3 mg/dL — ABNORMAL LOW (ref 8.9–10.3)
GFR calc Af Amer: 8 mL/min — ABNORMAL LOW (ref >60)
GFR calc non Af Amer: 7 mL/min — ABNORMAL LOW (ref >60)
Glucose, Bld: 112 mg/dL — ABNORMAL HIGH (ref 65–99)
Phosphorus: 5.8 mg/dL — ABNORMAL HIGH (ref 2.5–4.6)
Potassium: 6.1 mmol/L — ABNORMAL HIGH (ref 3.5–5.1)
Sodium: 129 mmol/L — ABNORMAL LOW (ref 135–145)

## 2017-05-30 LAB — HIV ANTIBODY (ROUTINE TESTING W REFLEX): HIV SCREEN 4TH GENERATION: NONREACTIVE

## 2017-05-30 SURGERY — INVASIVE LAB ABORTED CASE

## 2017-05-30 MED ORDER — CIPROFLOXACIN IN D5W 400 MG/200ML IV SOLN
400.0000 mg | INTRAVENOUS | Status: DC
  Administered 2017-05-30: 400 mg via INTRAVENOUS
  Filled 2017-05-30: qty 200

## 2017-05-30 MED ORDER — MIDAZOLAM HCL 10 MG/2ML IJ SOLN
INTRAMUSCULAR | Status: DC | PRN
  Administered 2017-05-30 (×2): 2 mg via INTRAVENOUS

## 2017-05-30 MED ORDER — DIPHENHYDRAMINE HCL 50 MG/ML IJ SOLN
INTRAMUSCULAR | Status: AC
  Filled 2017-05-30: qty 1

## 2017-05-30 MED ORDER — PROMETHAZINE HCL 25 MG/ML IJ SOLN: 6.2500 mg | INTRAMUSCULAR | Status: DC | PRN

## 2017-05-30 MED ORDER — ONDANSETRON HCL 4 MG/2ML IJ SOLN
INTRAMUSCULAR | Status: AC
  Filled 2017-05-30: qty 2

## 2017-05-30 MED ORDER — METRONIDAZOLE IN NACL 5-0.79 MG/ML-% IV SOLN
500.0000 mg | Freq: Three times a day (TID) | INTRAVENOUS | Status: DC
  Administered 2017-05-30: 500 mg via INTRAVENOUS
  Filled 2017-05-30 (×3): qty 100

## 2017-05-30 MED ORDER — MIDAZOLAM HCL 5 MG/ML IJ SOLN
INTRAMUSCULAR | Status: AC
  Filled 2017-05-30: qty 2

## 2017-05-30 MED ORDER — FENTANYL CITRATE (PF) 100 MCG/2ML IJ SOLN
INTRAMUSCULAR | Status: DC | PRN
  Administered 2017-05-30 (×3): 25 ug via INTRAVENOUS

## 2017-05-30 MED ORDER — MEPERIDINE HCL 100 MG/ML IJ SOLN: 6.2500 mg | INTRAMUSCULAR | Status: DC | PRN

## 2017-05-30 MED ORDER — ONDANSETRON HCL 4 MG/2ML IJ SOLN: 4.0000 mg | Freq: Once | INTRAMUSCULAR | Status: AC

## 2017-05-30 MED ORDER — NALOXONE HCL 0.4 MG/ML IJ SOLN
INTRAMUSCULAR | Status: AC
  Filled 2017-05-30: qty 1

## 2017-05-30 MED ORDER — DIPHENHYDRAMINE HCL 50 MG/ML IJ SOLN
INTRAMUSCULAR | Status: DC | PRN
  Administered 2017-05-30: 25 mg via INTRAVENOUS

## 2017-05-30 MED ORDER — SODIUM CHLORIDE 0.9 % IV SOLN
INTRAVENOUS | Status: DC
  Administered 2017-05-30 (×2): via INTRAVENOUS

## 2017-05-30 MED ORDER — FENTANYL CITRATE (PF) 100 MCG/2ML IJ SOLN
INTRAMUSCULAR | Status: AC
  Filled 2017-05-30: qty 2

## 2017-05-30 MED ORDER — MIDAZOLAM HCL 10 MG/2ML IJ SOLN
INTRAMUSCULAR | Status: DC | PRN
  Administered 2017-05-30 (×3): 2 mg via INTRAVENOUS

## 2017-05-30 MED ORDER — MIDAZOLAM HCL 2 MG/2ML IJ SOLN: 0.5000 mg | Freq: Once | INTRAMUSCULAR | Status: DC | PRN

## 2017-05-30 SURGICAL SUPPLY — 25 items

## 2017-05-31 MED FILL — Medication: Qty: 1 | Status: AC

## 2017-06-01 ENCOUNTER — Encounter (HOSPITAL_COMMUNITY): Payer: Self-pay | Admitting: Gastroenterology

## 2017-06-01 NOTE — Progress Notes (Signed)
On around 1:30 PM on 05/22/2017, I was able to get in touch with the family of Mr. Bredeson over telephone- notably his mother, and they explained to me that they did not want the patient's body to undergo an autopsy. I explained that the autopsy was the way to find out the reasoning behind the patient's cause of death, which we do not know. They declined an autopsy and I explained that we will respect their wishes.   Signed Burgess Estelle MD IMTS Resident

## 2017-06-03 NOTE — Code Documentation (Signed)
Correction to code documentation:  Per Nurse Charito Bokiagon's note it was mentioned that patient's death was pronounced by Faustino Luecke, but this is incorrect. Dr. Hetty Ely and Dr. Enid Derry were the residents who were in patient's room when patient expired and were involved in pronouncing death of the patient.  Lars Mage, MD Internal Medicine PGY1 [phone] 06/03/2017, 5:50 AM

## 2017-06-12 NOTE — Progress Notes (Signed)
Pt refused scd stating that he will going up frequently and does not feel like having it.

## 2017-06-12 NOTE — Progress Notes (Signed)
Pt unresponsive. Cpr initiated. Code team notified. Page to attending service. MD at bedside. Dorthey Sawyer, RN

## 2017-06-12 NOTE — Progress Notes (Signed)
Around 21:42,NT went to get patient's vital signs and found patient on the bed unresponsive. RN went in the room with another RN,code blue activated.CPR initiated.

## 2017-06-12 NOTE — Progress Notes (Signed)
Pharmacy Antibiotic Note  Curtis Rangel is a 41 y.o. male admitted on 06/08/2017 with rectal bleeding since 05/27/17. S/p flexible sigmoidoscopy today 2017/06/28 and findings suspicious for ischemic colitis.   Pharmacy has been consulted for Cipro and Flagyl IV dosing for colitis.  H/o ESRD on HD.  I will adjust cipro dose for ESRD.  Flagyl does not require any adjustment for renal impairment/ESRD.   Plan: Flagyl 500mg  IV q8h  Cirpo 400mg  IV q24h- will schedule in evenings so that dose can be given after HD on HD days.   Height: 6\' 2"  (188 cm) Weight: 155 lb (70.3 kg) IBW/kg (Calculated) : 82.2  Temp (24hrs), Avg:98.1 F (36.7 C), Min:97.4 F (36.3 C), Max:98.7 F (37.1 C)   Recent Labs Lab 05/22/2017 0901 05/17/2017 1245 05/26/2017 2145 06/28/2017 0609 06/28/17 0839  WBC 21.5* 19.8* 13.6* 9.5  --   CREATININE 13.28*  --   --  8.93* 9.78*    Estimated Creatinine Clearance: 9.9 mL/min (A) (by C-G formula based on SCr of 9.78 mg/dL (H)).    No Known Allergies  Antimicrobials this admission: Cipro 9/18>> Flagyl 9/18>>  Dose adjustments this admission: n/a  Microbiology results: 9/18 MRSA PCR: positive  Thank you for allowing pharmacy to be a part of this patient's care. Nicole Cella, RPh Clinical Pharmacist Pager: [phone] 06-28-2017 4:18 PM

## 2017-06-12 NOTE — Progress Notes (Signed)
   Subjective:  No acute events overnight. Patient states he was doing well this morning. Continues to have nausea that resolved with anti-emetics. Reports 3 bloody BMs this morning. Continues to have bilateral lower quadrant abdominal pain. Plan for EGD and colonoscopy today.   Objective:  Vital signs in last 24 hours: Vitals:   06/07/2017 2000 06/02/2017 2030 05/18/2017 2106 05/25/2017 2134  BP: (!) 170/105 130/89 (!) 163/102 (!) 149/96  Pulse: (!) 108 (!) 114 (!) 112 (!) 109  Resp:   20 18  Temp:   98.4 F (36.9 C) 98.7 F (37.1 C)  TempSrc:   Oral Oral  SpO2:   98% 99%  Weight:   153 lb 3.5 oz (69.5 kg) 153 lb (69.4 kg)  Height:    6\' 2"  (1.88 m)   General: pleasant male, well-nourished, well-developed, lying in bed in no acute distress Cardiac: regular rate and rhythm, nl S1/S2, no murmurs, rubs or gallops  Pulm: CTAB, no wheezes or crackles, no increased work of breathing  Ext: warm and well perfused, no peripheral edema    Assessment/Plan:  Curtis Rangel is a 41 year old male with a past medical history significant for 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 Lower BI bleed.   # GI Bleed: Unclear etiology at this time. Suspect recent increase use of Excedrin at home, but PUD, AVM, and ischemia also in the differential. Patient continues to have hematochezia. Currently hemodynamically stable with Hgb 15.2. GI following  - Follow-up EGD and colonoscopy report - CBC daily - Will continue gentle hydration with NS @ 20 cc/hr given ESRD   # ESRD 2/2 Goodpasture Syndrome with failed renal transplant: HD on TTS. Nephrology following. HD yesterday due to elevated K. Hypo/euvolemic on exam. Plan for HD on Th.  - Nephrology following  # HTN: - Started on nebivolol 10 mg QD per nephrology   F: NS @ 20cc/hr  E: will continue to monitor  N:  NPO for EGD/colonoscopy  VTE ppx: SCDs and ambulation   Code: Full code   Dispo: Anticipated discharge in approximately 1-2 day(s) pending EGD/colonoscopy results.   Welford Roche, MD  Internal Medicine PGY-1  P [phone]

## 2017-06-12 NOTE — Op Note (Signed)
[facility] Patient Name: Curtis Rangel Procedure Date : Jun 03, 2017 MRN: [id number] Attending MD: Otis Brace , MD Date of Birth: 1976/06/08 CSN: [id number] Age: 41 Admit Type: Inpatient Procedure:                Flexible Sigmoidoscopy Indications:              Rectal hemorrhage Providers:                Otis Brace, MD, Debara Pickett., Technician Referring MD:              Medicines:                please see EGD note for sedation. See the other                            procedure note for documentation of the                            administered medications Complications:            No immediate complications. Estimated Blood Loss:     Estimated blood loss: none. Procedure:                Pre-Anesthesia Assessment:                           - Prior to the procedure, a History and Physical                            was performed, and patient medications and                            allergies were reviewed. The patient's tolerance of                            previous anesthesia was also reviewed. The risks                            and benefits of the procedure and the sedation                            options and risks were discussed with the patient.                            All questions were answered, and informed consent                            was obtained. Prior Anticoagulants: The patient has                            taken no previous anticoagulant or antiplatelet                            agents. ASA Grade  Assessment: II - A patient with                            mild systemic disease. After reviewing the risks                            and benefits, the patient was deemed in                            satisfactory condition to undergo the procedure.                           After obtaining informed consent, the scope was                            passed under direct vision.  The [id number] ([id number])                            scope was introduced through the anus and advanced                            to the 20 to 25 cm from the anal verge. After                            obtaining informed consent, the scope was passed                            under direct vision. The flexible sigmoidoscopy was                            technically difficult and complex due to restricted                            mobility of the colon. Successful completion of the                            procedure was aided by increasing the dose of                            sedation medication. The quality of the bowel                            preparation was good. Scope In: 3:53:33 PM Scope Out: 4:06:06 PM Total Procedure Duration: 0 hours 12 minutes 33 seconds  Findings:      The perianal exam findings include non-thrombosed external hemorrhoids.      Diffuse severe inflammation characterized by altered vascularity,       congestion (edema), friability and granularity was found in the       recto-sigmoid colon.      the scope was only advanced up to 20-25 cm because of significant       restricted mobility of colon at rectosigmoid junctionas well as       patient's ongoing discomfort.      Internal hemorrhoids were  found during retroflexion. The hemorrhoids       were Grade II (internal hemorrhoids that prolapse but reduce       spontaneously). Impression:               - Non-thrombosed external hemorrhoids found on                            perianal exam.                           - Diffuse severe inflammation was found in the                            recto-sigmoid colon secondary to ischemic colitis.                           - Internal hemorrhoids.                           - No specimens collected. Moderate Sedation:      Moderate (conscious) sedation was personally administered by an       anesthesia professional. The following parameters were monitored: oxygen        saturation, heart rate, blood pressure, and response to care. Recommendation:           - Return patient to hospital ward for ongoing care.                           - Clear liquid diet.                           - Use broad spectrum antibiotics for 2 weeks. Procedure Code(s):        --- Professional ---                           [phone], Sigmoidoscopy, flexible; diagnostic,                            including collection of specimen(s) by brushing or                            washing, when performed (separate procedure) Diagnosis Code(s):        --- Professional ---                           K55.9, Vascular disorder of intestine, unspecified                           K64.4, Residual hemorrhoidal skin tags                           K62.5, Hemorrhage of anus and rectum CPT copyright 2016 [employer]. All rights reserved. The codes documented in this report are preliminary and upon coder review may  be revised to meet current compliance requirements. Otis Brace, MD Otis Brace, MD 06/09/17 4:30:01 PM Number of Addenda: 0

## 2017-06-12 NOTE — Op Note (Signed)
[facility] Patient Name: Curtis Rangel Procedure Date : 06/13/17 MRN: [id number] Attending MD: Otis Brace , MD Date of Birth: 04/29/1976 CSN: [id number] Age: 41 Admit Type: Inpatient Procedure:                Upper GI endoscopy Indications:              Coffee-ground emesis Providers:                Otis Brace, MD, Debara Pickett., Technician Referring MD:              Medicines:                Midazolam 10 mg IV, Fentanyl 75 micrograms IV,                            Diphenhydramine 25 mg IV Complications:            No immediate complications. Estimated Blood Loss:     Estimated blood loss: none. Procedure:                Pre-Anesthesia Assessment:                           - Prior to the procedure, a History and Physical                            was performed, and patient medications and                            allergies were reviewed. The patient's tolerance of                            previous anesthesia was also reviewed. The risks                            and benefits of the procedure and the sedation                            options and risks were discussed with the patient.                            All questions were answered, and informed consent                            was obtained. Prior Anticoagulants: The patient has                            taken no previous anticoagulant or antiplatelet                            agents. ASA Grade Assessment: II - A patient with  mild systemic disease. After reviewing the risks                            and benefits, the patient was deemed in                            satisfactory condition to undergo the procedure.                           After obtaining informed consent, the endoscope was                            passed under direct vision. Throughout the                            procedure, the patient's  blood pressure, pulse, and                            oxygen saturations were monitored continuously. The                            EG-[id number] ([id number]) scope was introduced through the                            mouth, with the intention of advancing to the                            duodenum. The scope was advanced to the                            cricopharyngeal esophagus before the procedure was                            aborted. Medications were given. The upper GI                            endoscopy was extremely difficult due to the                            patient's inability to cooperate. The patient                            tolerated the procedure poorly due to the patient's                            poor cooperation. Scope In: Scope Out: Findings:      procedure was aborted because of poor patient cooperation. Impression:               - No specimens collected. Moderate Sedation:      Moderate (conscious) sedation was administered by the endoscopy nurse       and supervised by the endoscopist. The following parameters were       monitored: oxygen saturation, heart rate, blood pressure, and response       to care.  Recommendation:           proceed with colonoscopy                           - Put patient on a clear liquid diet starting today.                           - Continue present medications.                           - Patient has a contact number available for                            emergencies. The signs and symptoms of potential                            delayed complications were discussed with the                            patient. Return to normal activities tomorrow.                            Written discharge instructions were provided to the                            patient. Procedure Code(s):        --- Professional ---                           [phone], 53, Esophagogastroduodenoscopy, flexible,                            transoral; diagnostic,  including collection of                            specimen(s) by brushing or washing, when performed                            (separate procedure) Diagnosis Code(s):        --- Professional ---                           K92.0, Hematemesis CPT copyright 2016 American Medical Association. All rights reserved. The codes documented in this report are preliminary and upon coder review may  be revised to meet current compliance requirements. Otis Brace, MD Otis Brace, MD Jun 22, 2017 4:24:31 PM Number of Addenda: 0

## 2017-06-12 NOTE — Plan of Care (Signed)
Problem: Bleeding Precautions Goal: Reviewed GI bleed patient education handouts with pt/family Outcome: Progressing Will have no more rectal bleeding as shown in his stool whenever he goes to the bathroom Goal: Pain control Patient will demonstrate personal actions to control pain.   Outcome: Progressing Pt will show tolerance to after adm pain meds. As ordered

## 2017-06-12 NOTE — Procedures (Signed)
Intubation Procedure Note JERONE CUDMORE [id number] [birth year]  Procedure: Intubation Indications: Respiratory insufficiency  Procedure Details Consent: Unable to obtain consent because of emergent medical necessity. Time Out: Verified patient identification, verified procedure, site/side was marked, verified correct patient position, special equipment/implants available, medications/allergies/relevent history reviewed, required imaging and test results available.  Performed  Maximum sterile technique was used including antiseptics, gloves, gown and hand hygiene.  MAC and 4    Evaluation Hemodynamic Status: Transient hypotension ; O2 sats: unresponsive, no perfusion Patient's Current Condition: unstable Complications: No apparent complications Patient did tolerate procedure well. Chest X-ray ordered to verify placement.  CXR: pending.  Pt intubated per Linton Rump, RRT due to respiratory insufficiency/unresponsiveness. CPR in progress on arrival. ACLS protocol followed. Pt intubated with a MAC 4. No complications noted. Positive color change, BBS breath sounds noted per CRNA. MD at bedside.   Leigh Aurora, BS, RRT RCP Jun 23, 2017

## 2017-06-12 NOTE — Progress Notes (Signed)
Attempted to call patient's mom,Linda several times but nobody is picking up phone. 22:09 Patient pronounced expired by Chundi,MD. 22:30 Patient's father,Jimmy Fatima Sanger notified by Chundi,MD of patient's expiration. 22:55 Spoke with Octavia Bruckner O.,ME,patient is not a [employer] case. 23:00 Called [facility] and spoke with Henri Medal, referral # [id number].Patient is a potential for tissue and eye donor.

## 2017-06-12 NOTE — Progress Notes (Signed)
Pt noted still with minimal bloody stool when he goes to the bathroom after he started the prep for EGD and colonoscopy. Pt unable to cont with the prep dt not feeling good and unable to tolerate the whole jug. He was able finish half of the jug though and Dr. Orson Gear was just beeped to inform of the unfinished prep. Pt went to the bathroom 3 times. Consent signed. Awaiting for Dr. Rosalie Gums to call back.

## 2017-06-12 NOTE — Brief Op Note (Signed)
05/25/2017 - 06/08/17  4:09 PM  PATIENT:  Curtis Rangel  41 y.o. male  PRE-OPERATIVE DIAGNOSIS:   rectal bleeding  POST-OPERATIVE DIAGNOSIS:  Colitis  PROCEDURE:  Flexible sigmoidoscopy  SURGEON:  Surgeon(s) and Role:    * Neilani Duffee, MD - Primary  Findings/recommendations --------------------------------------- - Unfortunately, anesthesia assistance was not available for patient's procedure today ( procedure was requested with MAC with CRNA )  Procedure  was attempted with moderate sedation. Despite of giving him 10 mg of Versed, 75 of fentanyl and 25 of Benadryl, I was not able to sedate patient. He removed his bite block and he did not allow [place] to do EGD. - Subsequently colonoscopy was attempted. Patient was found to have active colitis starting in the rectosigmoid area with evidence of recent bleeding. There was significant narrowing at the rectosigmoid junction. I was not able to advance a regular colonoscope. Subsequently upper endoscope was used and still I was not able to advance scope beyond 25 cm. Because  of difficulty in procedure as well as ongoing patient's discomfort, procedure was then aborted.   - Findings suspicious for ischemic colitis , although CT scan was negative.  - Recommend starting ciprofloxacin and Flagyl IV. Pharmacy consult placed to adjust dose for his end-stage renal disease. Continue IV twice a day PPI for now. - Okay to have clear liquid diet, advance as tolerated. - Monitor H&H. - GI will follow.    Otis Brace MD, [place] 2017/06/08, 4:16 PM  Pager [phone]  If no answer or after 5 PM call [phone]

## 2017-06-12 NOTE — Death Summary Note (Addendum)
  Name: Curtis Rangel MRN: [id number] DOB: [birth year] 41 y.o.  Date of Admission: 05/13/2017  8:48 AM Date of Discharge: 06/02/2017 Attending Physician: Dr. Bartholomew Crews   Cause of death: Complications of GI bleed suspected 2/2 ischemic colitis. 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    Hospital Course: 1. Curtis Rangel is a 41 y.o. 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  He was hemodynamically stable with Hgb 17 on presentation and was evaluated by GI with plan to do EGD and colonoscopy. Unfortunately, anesthesia not available for procedure and patient was sedated. GI not able to perform EDG and colonoscopy showed rectosigmoid inflammation concerning for ischemic colitis.  However, GI unable to evaluate entire colon due to significant inflammation. Patient tolerated procedure well and remained hemodynamically stable. He was later found unresponsive in his room by RN. Code blue called, but unable to resuscitate patient. He was pronounced death 06-11-2023 at 2207/12/20.   Signed: Welford Roche, MD 06/02/2017, 5:29 PM

## 2017-06-12 NOTE — Consult Note (Signed)
           [facility] CM Primary Care Navigator  2017-06-28  Curtis Rangel Nov 22, 1975 [id number]    Went to see patient at the bedsideearlier to identify possible discharge needs but staffreports that heis off the unit for a procedure at ENDO at this time.    Will attemptto see patient at another time when he is available in the room.      Addendum: (06/02/2017)  Jeryl Columbia to see patient earlier today at the bedside to identify possible discharge needs but staff reports that patient expired last night around 22:30.   For questions, please contact:  Dannielle Huh, BSN, RN- [facility] Primary Care Navigator  Telephone: [phone] [place]

## 2017-06-12 NOTE — Code Documentation (Addendum)
CODE BLUE NOTE  Patient Name: Curtis Rangel   MRN: [id number]   Date of Birth/ Sex: 04-11-1976 , male      Admission Date: 06/05/2017  Attending Provider: Bartholomew Crews, MD  Primary Diagnosis: Dehydration [E86.0] Rectal bleeding [K62.5]    Indication: Pt was in his usual state of health until this PM, when he was noted to be asystolic. Code blue was subsequently called. At the time of arrival on scene, IV access was being obtained and ACLS protocol was underway.   Technical Description:  - CPR performance duration:  27 minutes  - Was defibrillation or cardioversion used? No   - Was external pacer placed? No  - Was patient intubated pre/post CPR? Yes    Medications Administered: Y = Yes; Blank = No Amiodarone  N  Atropine  N  Calcium  Y  Epinephrine  Y  Lidocaine  N  Magnesium  N  Norepinephrine  N  Phenylephrine  N  Sodium bicarbonate  Y  Vasopressin  N  Other: Narcan N    Post CPR evaluation:  - Final Status - Was patient successfully resuscitated ? No   Miscellaneous Information:  - Time of death:  22:09 PM  - Primary team notified?  Yes  - Family Notified? Called mother multiple times with no answer        Timon Geissinger, [place], DO   06/20/17, 10:16 PM

## 2017-06-12 NOTE — Progress Notes (Signed)
[facility] Progress Note  Curtis Rangel 41 y.o. 03/22/1976  CC:  GI bleed   Subjective: Patient continued to have rectal bleeding during the prep for colonoscopy. Has only finished half gallon of his prep. Denied any further nausea, vomiting or coffee-ground emesis. Continues to have bilateral lower quadrant abdominal pain.  ROS : Negative for chest pain and shortness of breath. Negative for weakness.   Objective: Vital signs in last 24 hours: Vitals:   05/28/2017 2106 05/13/2017 2134  BP: (!) 163/102 (!) 149/96  Pulse: (!) 112 (!) 109  Resp: 20 18  Temp: 98.4 F (36.9 C) 98.7 F (37.1 C)  SpO2: 98% 99%    Physical Exam:  General:  Alert, cooperative, no distress, appears stated age  Head:  Normocephalic, without obvious abnormality, atraumatic  Eyes:  , EOM's intact,   Lungs:   Clear to auscultation bilaterally, respirations unlabored  Heart:  Regular rate and rhythm, S1, S2 normal  Abdomen:   Soft, Bilateral lower quadrant tenderness to palpation, bowel sounds active all four quadrants,  no masses, no peritoneal sign   Extremities: Extremities normal, atraumatic, no  edema  Pulses: 2+ and symmetric    Lab Results:  Recent Labs  Jun 16, 2017 0609 2017-06-16 0839  NA 129* 127*  K 6.1* 5.4*  CL 87* 85*  CO2 28 24  GLUCOSE 112* 119*  BUN 39* 43*  CREATININE 8.93* 9.78*  CALCIUM 8.3* 8.3*  PHOS 5.8*  --     Recent Labs  06/07/2017 0901 06/16/2017 0609  AST 36  --   ALT 19  --   ALKPHOS 68  --   BILITOT 1.0  --   PROT 9.6*  --   ALBUMIN 4.5 3.3*    Recent Labs  06/05/2017 2145 Jun 16, 2017 0609  WBC 13.6* 9.5  NEUTROABS  --  8.7*  HGB 16.7 15.2  HCT 48.8 45.3  MCV 92.4 92.8  PLT 146* 135*   No results for input(s): LABPROT, INR in the last 72 hours.    Assessment/Plan: - Coffee-ground emesis as well as bright red blood per rectum. D/D Ulcer disease vs AVMs vs Ischemic event given his episode of low blood pressure and Saturday. - End-stage  renal disease. On hemodialysis - History of peripheral vascular disease  Recommendations -------------------------- - Hemoglobin remained stable. Continues to have ongoing bloody diarrhea. EGD and colonoscopy today for further evaluation. Risk benefits alternatives discussed with the patient. Verbalized understanding. - CT abdomen pelvis showed no acute changes to explain his GI bleed. - Continue IV twice a day PPI for now. Avoid NSAIDs. -  Further plan based on procedure findings.   Otis Brace MD, [place] Jun 16, 2017, 10:45 AM  Pager [phone]  If no answer or after 5 PM call [phone]

## 2017-06-12 NOTE — Progress Notes (Signed)
Patient continues to complain of pain unrelieved by Dilaudid. MD notified and made aware. Will continue to monitor.

## 2017-06-12 NOTE — Plan of Care (Signed)
Problem: Bleeding Precautions Goal: Reviewed GI bleed patient education handouts with pt/family Outcome: Progressing Pt will have no bleeding anymore from his stool  Goal: Pain control Patient will demonstrate personal actions to control pain.  Outcome: Progressing Pain will be tolerable and prn meds will be adm as needed

## 2017-06-12 NOTE — Plan of Care (Signed)
Problem: Education: Goal: Knowledge of [facility] Education information/materials will improve Outcome: Progressing Pt will have knowledge of prevention from infection by washing hand properly and utilizing disnifectant available in the hospital  Problem: Safety: Goal: Ability to remain free from injury will improve Outcome: Progressing Discussed with pt infection prevention  Problem: Health Behavior/Discharge Planning: Goal: Ability to manage health-related needs will improve Outcome: Progressing Encouraged to comply with the tx plan  Problem: Pain Managment: Goal: General experience of comfort will improve Monitor pain status  Problem: Physical Regulation: Goal: Ability to maintain clinical measurements within normal limits will improve Outcome: Progressing Assess response to tx Goal: Will remain free from infection Provide infection prevention measures  Problem: Skin Integrity: Goal: Risk for impaired skin integrity will decrease Assess and monitor skin integrity  Problem: Tissue Perfusion: Goal: Risk factors for ineffective tissue perfusion will decrease Outcome: Progressing Assess for VTE

## 2017-06-12 NOTE — Progress Notes (Signed)
  Date: 06/24/2017  Patient name: Curtis Rangel  Medical record number: [id number]  Date of birth: [birth year]   I have seen and evaluated Curtis Rangel and discussed their care with the Residency Team. Curtis Rangel is a 41 year old who presented with chief complaint of bright red blood in stools since the 15th. 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 He had hemodialysis on the 15th and reportedly had hypotension. Subsequently, he had nausea, vomiting with coffee-ground emesis per the family member who is a Marine scientist, and new lower abdominal pain (he has had lower abdominal pain in the past that this is a new type of abdominal pain). He also had bright red blood per rectum. He came into the ED and was seen by GI. A colonoscopy and EGD planned for later today. He states he continued to have bright red blood per rectum 3 during his GI preparation.  Vitals:   05/19/2017 2134 June 24, 2017 1317  BP: (!) 149/96 (!) 148/104  Pulse: (!) 109 (!) 106  Resp: 18 15  Temp: 98.7 F (37.1 C) 97.9 F (36.6 C)  SpO2: 99% 97%  NAD Skin : tanned all over HRRR no MRG LCTAB with good air flow ABD soft, + BS  Gap 32 CO2 20 HgB stable at 15.2  I personally viewed the EKG and confirmed my reading with the official read. Sinus, RAD, RAH, LAH, ? Q V1 and V2  Assessment and Plan: I have seen and evaluated the patient as outlined above. 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 He developed hypotension during dialysis on the 15th and then subsequently developed nausea, coffee-ground emesis, bright red blood per rectum, and lower abdominal pain. A CT scan was unrevealing in regards to the etiology of his symptoms and an EGD and colonoscopy are planned for today. He remained hemodynamically stable without a  drop in his hemoglobin despite ongoing bright red blood per rectum. The most likely cause of his blood loss is a lower GI bleed as if this were an upper source, he would have a massive blood loss in order to have bright red blood per rectum which would be associated with hemodynamic instability and a drop in his hemoglobin.  1. G I blood loss, presumed lower GI bleed - await colonoscopy and EGD results. Continue to follow hemoglobin although daily is likely sufficient.  2. ESRD - follow renal recs. Had HD yesterday.  Bartholomew Crews, MD 10-13-182:42 PM

## 2017-06-12 NOTE — Progress Notes (Signed)
[facility] Progress Note   Dialysis Orders:TTS, 4,25hrs -180NRe Optiflus, BFR 500, DFR Autoflow 1.5, UFR profile 4. -EDW 69.5, net UF goal no more than 5L. Standing weights pre and post HD. -Dialysate 2K, 3.5Ca, 1Mg .  -RFP and CBC pre HD -Last intervention LU AVF - 93-81% outflow basilic swing stenosis 0/1/75 - Dr. Augustin Coupe, [place]  Assessment/Plan: 1.  GI Bleed/Abdominal pain        -EGD and colonoscopy scheduled this AM       -IV Protonix BID  -pain meds given prn 2. Leukocytosis  -Resolved, continue to monitor daily labs 3. 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  -K elevated this AM, repeat BMP, HD to be resumed on regular schedule Thurs, pending K result.    -Possible recirculation due to access narrowing? Seen at [facility] for PTA q3 months over the last year, last intervention 02/15/17.  Unlikely the source of elevated K, ultrafiltration appears adequate based on resulting decrease in BUN and Scr. Continue to monitor, and investigate further if necessary.  4.  Hypertension/volume               -Continue Bystolic qd  -NPO overnight due to procedures this AM, gentle fluids given. D/C when normal diet resumed. 5.  Anemia         - Hgb within normal limits, but trending down since admission 17.3>16.1>15.2. Monitor closely.        -Continue daily CBC. 6.  Metabolic bone disease - s/p parathyroidectomy              -Corrected Ca 8.9, follow trend.             -Mild hyperphosphatemia on new labs, noted improvement from previous labs.  Continue binder- 4 Phoslo TID ac.             -Last PTH within normal range.             -Continue Calcitriol 0.67mcg 2x wk 7. Nutrition Albumin 3.3             -Renal diet             -Renavite qd and Nepro TID before meals  Jen Mow, PA-C [place] 06/28/17,9:17 AM  LOS: 1 day   Pt seen, examined and agree w A/P as above.  Kelly Splinter MD [employer] pager [phone]   2017/06/28, 2:59 PM    Subjective:   Mr. Glantz feels about the same today, diffuse abdominal pain with concentration in lower abdomen/ upper pelvis, rated at 9/10.  Admits to continued nausea, bloody stool this AM, described as "bloody water", body aches, dyspepsia and persistent hiccups over the last several hours.  Denies vomiting since admission, chest pain, SOB, and edema.  Awaiting AM procedures, s/p ingestion of as much prep he could tolerate.   Objective Vitals:   05/16/2017 2000 05/21/2017 2030 05/20/2017 2106 05/14/2017 2134  BP: (!) 170/105 130/89 (!) 163/102 (!) 149/96  Pulse: (!) 108 (!) 114 (!) 112 (!) 109  Resp:   20 18  Temp:   98.4 F (36.9 C) 98.7 F (37.1 C)  TempSrc:   Oral Oral  SpO2:   98% 99%  Weight:   69.5 kg (  153 lb 3.5 oz) 69.4 kg (153 lb)  Height:    6\' 2"  (1.88 m)   Physical Exam General: NAD, WDWN, laying in bed, appears tired Heart: tachycardia, regular rhythm, no murmur, rubs or gallops Lungs: CTA B/L, no wheeze, crackles or rhonchi Abdomen:soft, non-distended, no guarding, +BS, +tenderness throughout Extremities: no edema, no ulcerations Dialysis Access: LU AVF, +thrill, +bruit, multiple small chronic thinned aneurysmal areas without change.   Additional Objective Labs: Basic Metabolic Panel:  Recent Labs Lab 05/23/2017 0901 2017-06-06 0609  NA 127* 129*  K 5.2* 6.1*  CL 75* 87*  CO2 20* 28  GLUCOSE 185* 112*  BUN 53* 39*  CREATININE 13.28* 8.93*  CALCIUM 9.3 8.3*  PHOS  --  5.8*   Liver Function Tests:  Recent Labs Lab 05/20/2017 0901 2017/06/06 0609  AST 36  --   ALT 19  --   ALKPHOS 68  --   BILITOT 1.0  --   PROT 9.6*  --   ALBUMIN 4.5 3.3*    Recent Labs Lab 06/09/2017 0901  LIPASE 27   CBC:  Recent Labs Lab 06/11/2017 0901 06/09/2017 1245  06/10/2017 2145 June 06, 2017 0609  WBC 21.5* 19.8* 13.6* 9.5  NEUTROABS  --   --   --  8.7*  HGB 17.3* 16.1 16.7 15.2  HCT 50.4 48.3 48.8 45.3  MCV 93.3 92.7 92.4 92.8  PLT 187 167 146* 135*   Blood Culture    Component Value Date/Time   SDES STOOL 09/27/2009 2037   SDES STOOL 09/27/2009 2037   SPECREQUEST NONE 09/27/2009 2037   SPECREQUEST NONE 09/27/2009 2037   CULT  09/27/2009 2037    NO SALMONELLA, SHIGELLA, CAMPYLOBACTER, OR YERSINIA ISOLATED   REPTSTATUS 10/01/2009 FINAL 09/27/2009 2037   REPTSTATUS 09/29/2009 FINAL 09/27/2009 2037    Cardiac Enzymes: No results for input(s): CKTOTAL, CKMB, CKMBINDEX, TROPONINI in the last 168 hours. CBG: No results for input(s): GLUCAP in the last 168 hours. Iron Studies: No results for input(s): IRON, TIBC, TRANSFERRIN, FERRITIN in the last 72 hours. Lab Results  Component Value Date   INR 1.10 09/27/2009   INR 1.09 09/27/2009   Studies/Results: Ct Abdomen Pelvis W Contrast  Result Date: 05/22/2017 CLINICAL DATA:  Acute lower abdominal pain. EXAM: CT ABDOMEN AND PELVIS WITH CONTRAST TECHNIQUE: Multidetector CT imaging of the abdomen and pelvis was performed using the standard protocol following bolus administration of intravenous contrast. CONTRAST:  16mL ISOVUE-300 IOPAMIDOL (ISOVUE-300) INJECTION 61% COMPARISON:  CT scan of Feb 03, 2012. FINDINGS: Lower chest: No acute abnormality. Hepatobiliary: No gallstones are noted. Fatty infiltration of the right hepatic lobe is noted. Pancreas: Unremarkable. No pancreatic ductal dilatation or surrounding inflammatory changes. Spleen: Normal in size without focal abnormality. Adrenals/Urinary Tract: Adrenal glands appear normal. Stable severe bilateral renal atrophy is noted with multiple cysts seen, consistent with history of end-stage renal disease. No hydronephrosis or renal obstruction is noted. Urinary bladder is unremarkable. Stomach/Bowel: Stomach is within normal limits. Appendix appears normal.  No evidence of bowel wall thickening, distention, or inflammatory changes. Vascular/Lymphatic: Aortic atherosclerosis. No enlarged abdominal or pelvic lymph nodes. Reproductive: Prostate is unremarkable. Other: No abnormal fluid collection is noted. No hernia is noted. Calcification in scarring is seen in the right lower quadrant most consistent with prior renal transplant. Musculoskeletal: Stable sclerotic densities are noted in both femoral heads suggesting early avascular necrosis. IMPRESSION: Fatty infiltration of the liver. Severe bilateral renal atrophy is noted with multiple cysts seen, consistent with history of end-stage renal disease. Aortic  atherosclerosis. Scarring and calcification seen in right lower quadrant of abdomen most consistent with prior renal transplant. 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 Electronically Signed   By: Marijo Conception, M.D.   On: 05/28/2017 10:44   Medications: . sodium chloride Stopped (26-Jun-2017 0246)  . sodium chloride    . sodium chloride 20 mL/hr at 06/26/2017 0247   . [START ON 06/01/2017] calcitRIOL  0.25 mcg Oral Q T,Th,Sa-HD  . calcium acetate  667 mg Oral TID WC  . feeding supplement (NEPRO CARB STEADY)  237 mL Oral TID AC  . multivitamin  1 tablet Oral QHS  . nebivolol  10 mg Oral QHS  . pantoprazole (PROTONIX) IV  40 mg Intravenous BID  . sodium chloride flush  3 mL Intravenous Q12H  . sodium chloride flush  3 mL Intravenous Q12H

## 2017-06-12 NOTE — Anesthesia Preprocedure Evaluation (Deleted)
Anesthesia Evaluation  Patient identified by MRN, date of birth, ID band Patient awake    Reviewed: Allergy & Precautions, NPO status , Patient's Chart, lab work & pertinent test results, reviewed documented beta blocker date and time   Airway Mallampati: I  TM Distance: >3 FB     Dental  (+) Dental Advisory Given   Pulmonary COPD,  COPD inhaler, Current Smoker,    breath sounds clear to auscultation       Cardiovascular hypertension, Pt. on medications and Pt. on home beta blockers (-) angina Rhythm:Regular Rate:Tachycardia  '15 ECHO: EF 60-65%, valves OK '15 Stress: no ischemia, EF 69%   Neuro/Psych    GI/Hepatic GI bleed   Endo/Other  negative endocrine ROS  Renal/GU ESRF and DialysisRenal disease (last dialyzed yesterday, K+ 5.4)Goodpasture's     Musculoskeletal   Abdominal   Peds  Hematology negative hematology ROS (+)   Anesthesia Other Findings   Reproductive/Obstetrics                            Anesthesia Physical Anesthesia Plan  ASA: III  Anesthesia Plan: MAC   Post-op Pain Management:    Induction: Intravenous  PONV Risk Score and Plan: 1 and Ondansetron, Dexamethasone and Treatment may vary due to age or medical condition  Airway Management Planned: Natural Airway and Nasal Cannula  Additional Equipment:   Intra-op Plan:   Post-operative Plan:   Informed Consent: I have reviewed the patients History and Physical, chart, labs and discussed the procedure including the risks, benefits and alternatives for the proposed anesthesia with the patient or authorized representative who has indicated his/her understanding and acceptance.   Dental advisory given  Plan Discussed with: CRNA and Surgeon  Anesthesia Plan Comments: (Plan routine monitors, MAC *plan changed to moderate sedation by GI team)       Anesthesia Quick Evaluation

## 2017-06-12 DEATH — deceased

## 2018-10-11 IMAGING — CT CT ABD-PELV W/ CM
2 of 5 series · 16 of 46 positions shown, 18 images · IV contrast (APPLIED)
Comparison: CT scan of February 03, 2012.

CLINICAL DATA: Acute lower abdominal pain.

EXAM:
CT ABDOMEN AND PELVIS WITH CONTRAST
TECHNIQUE: Multidetector CT imaging of the abdomen and pelvis was performed
using the standard protocol following bolus administration of
intravenous contrast.
CONTRAST:  100mL PG13RP-ATT IOPAMIDOL (PG13RP-ATT) INJECTION 61%

[Series 3: abdomen 5.0 · axial · 0.75mm/px · z∈[+782,+1192]mm · 13 of 96 slices shown, 15 images]
[im 7/96  soft-tissue]
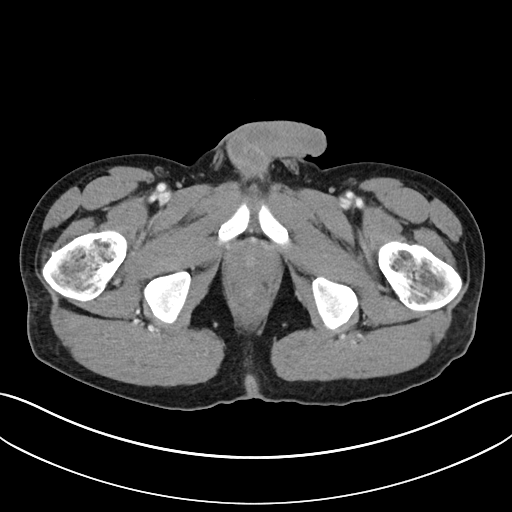
[im 7/96  bone]
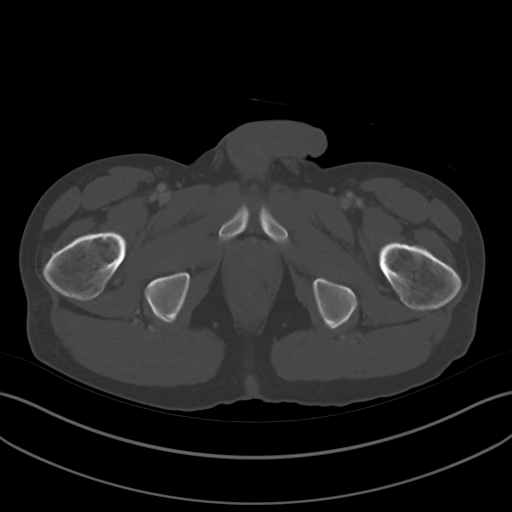
[im 13/96  soft-tissue]
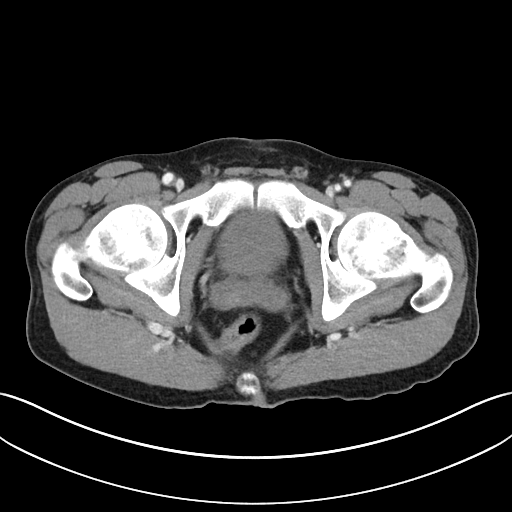
[im 20/96  soft-tissue]
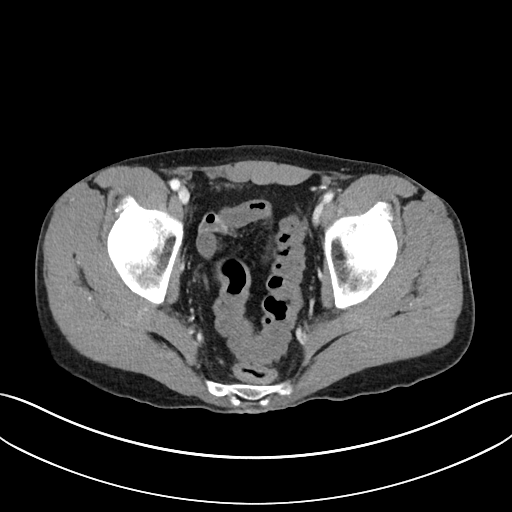
[im 26/96  soft-tissue]
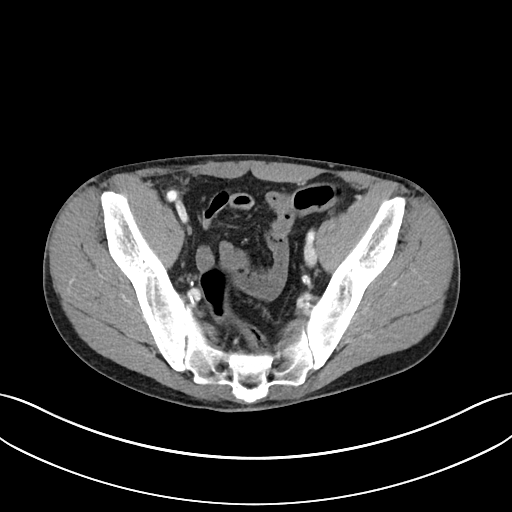
[im 32/96  soft-tissue]
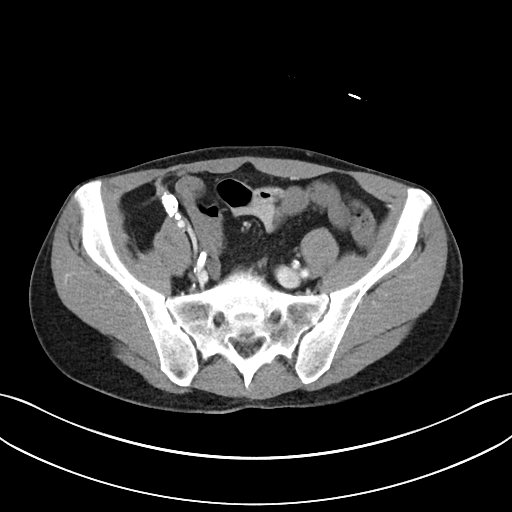
[im 39/96  soft-tissue]
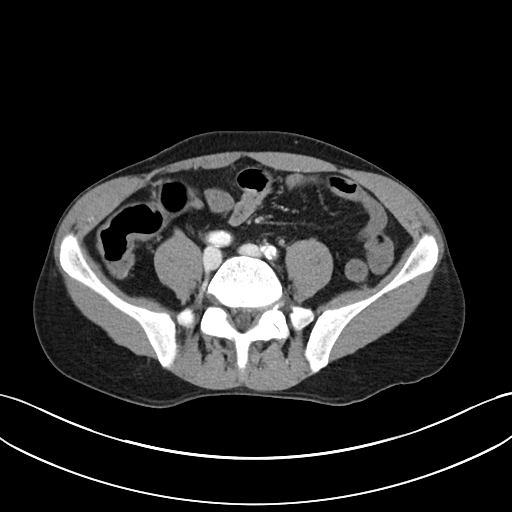
[im 51/96  soft-tissue]
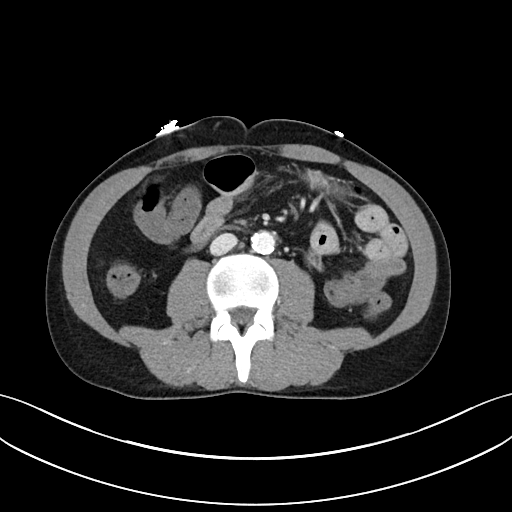
[im 58/96  soft-tissue]
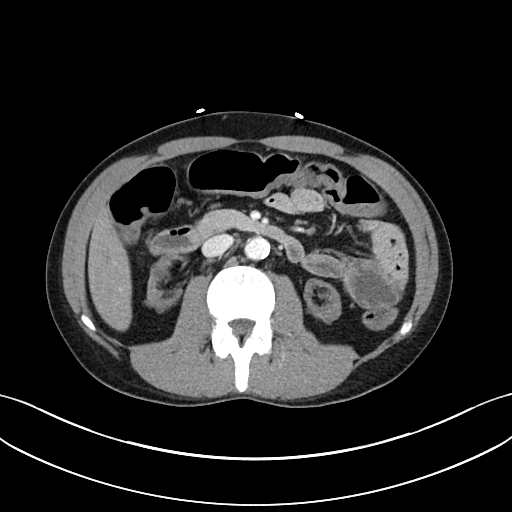
[im 64/96  soft-tissue]
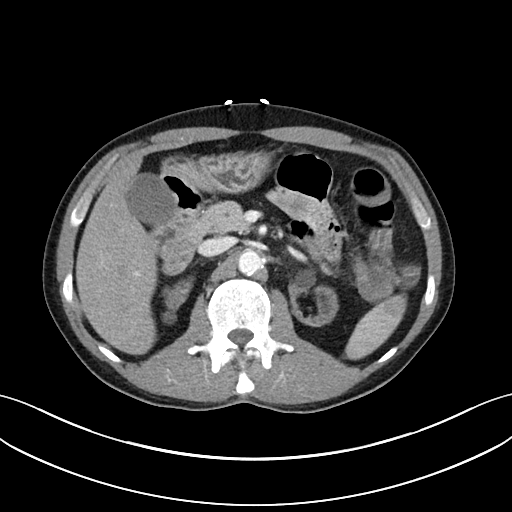
[im 64/96  bone]
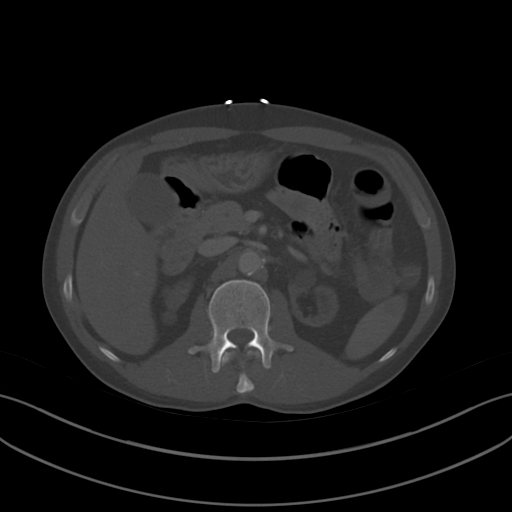
[im 70/96  soft-tissue]
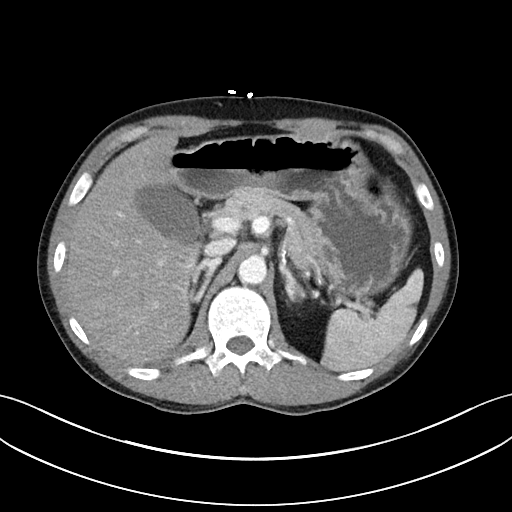
[im 77/96  soft-tissue]
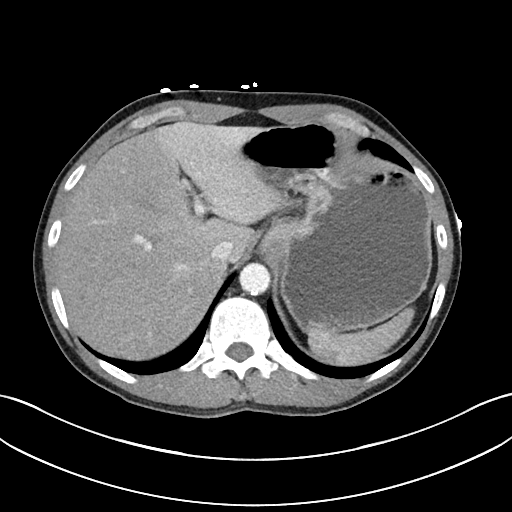
[im 83/96  soft-tissue]
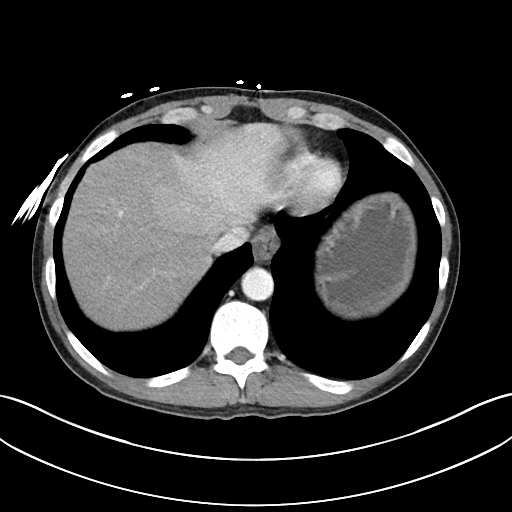
[im 89/96  soft-tissue]
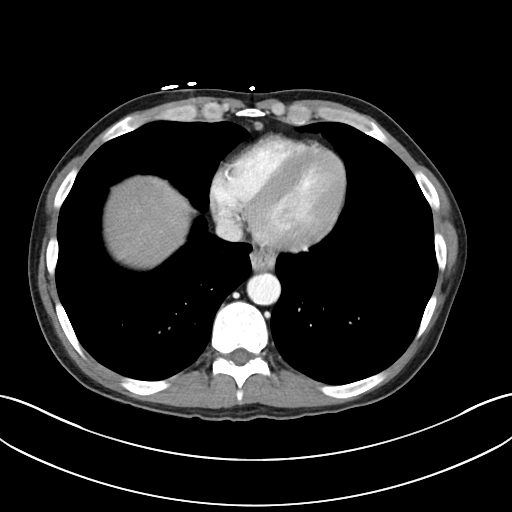

[Series 6: abdomen 3.0 mpr cor · coronal · 0.77mm/px · 3 of 77 slices shown]
[im 26/77  soft-tissue]
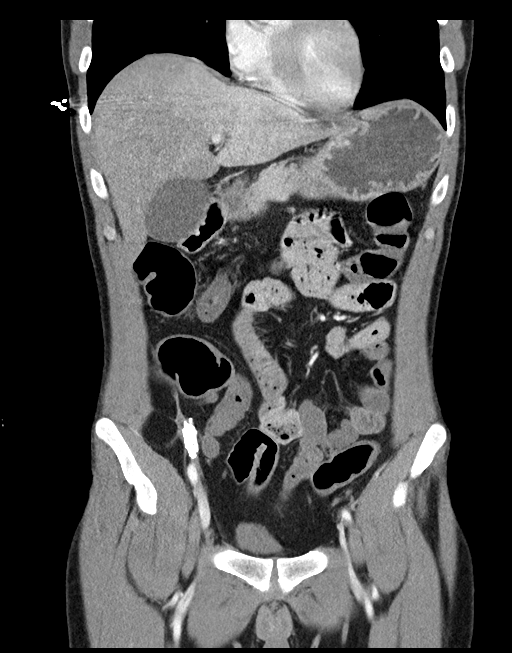
[im 34/77  soft-tissue]
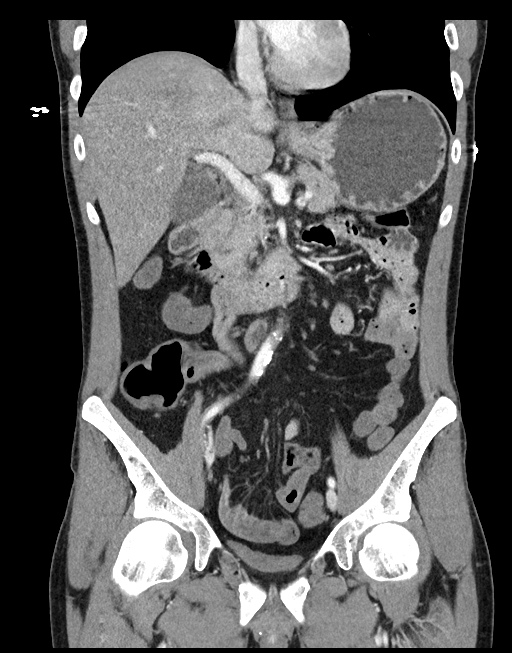
[im 43/77  soft-tissue]
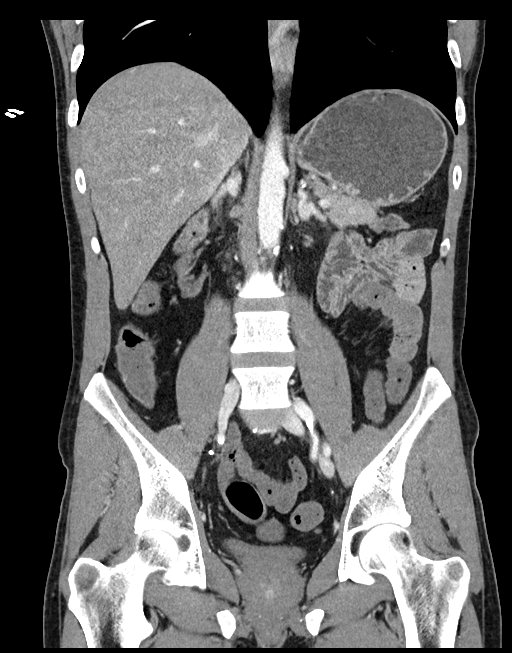

[16 of 46 positions shown; findings below may reference images not displayed]

FINDINGS: Lower chest: No acute abnormality.

Hepatobiliary: No gallstones are noted. Fatty infiltration of the
right hepatic lobe is noted.

Pancreas: Unremarkable. No pancreatic ductal dilatation or
surrounding inflammatory changes.

Spleen: Normal in size without focal abnormality.

Adrenals/Urinary Tract: Adrenal glands appear normal. Stable severe
bilateral renal atrophy is noted with multiple cysts seen,
consistent with history of end-stage renal disease. No
hydronephrosis or renal obstruction is noted. Urinary bladder is
unremarkable.

Stomach/Bowel: Stomach is within normal limits. Appendix appears
normal. No evidence of bowel wall thickening, distention, or
inflammatory changes.

Vascular/Lymphatic: Aortic atherosclerosis. No enlarged abdominal or
pelvic lymph nodes.

Reproductive: Prostate is unremarkable.

Other: No abnormal fluid collection is noted. No hernia is noted.
Calcification in scarring is seen in the right lower quadrant most
consistent with prior renal transplant.

Musculoskeletal: Stable sclerotic densities are noted in both
femoral heads suggesting early avascular necrosis.
IMPRESSION: Fatty infiltration of the liver.

Severe bilateral renal atrophy is noted with multiple cysts seen,
consistent with history of end-stage renal disease.

Aortic atherosclerosis.

Scarring and calcification seen in right lower quadrant of abdomen
most consistent with prior renal transplant.

Stable findings consistent with early avascular necrosis of both
femoral heads.
